# Patient Record
Sex: Female | Born: 1960 | Race: White | Hispanic: Yes | Marital: Single | Smoking: Never smoker
Health system: Southern US, Community
[De-identification: ages and names within clinical notes are randomized; demographics above are authoritative.]

## PROBLEM LIST (undated history)

## (undated) DIAGNOSIS — J45909 Unspecified asthma, uncomplicated: Secondary | ICD-10-CM

## (undated) HISTORY — PX: RETINAL LASER PROCEDURE: SHX2339

## (undated) HISTORY — PX: CYSTECTOMY: SUR359

## (undated) HISTORY — PX: RETINAL DETACHMENT SURGERY: SHX105

---

## 2007-12-29 ENCOUNTER — Ambulatory Visit: Payer: Self-pay | Admitting: Oncology

## 2008-02-02 ENCOUNTER — Emergency Department (HOSPITAL_COMMUNITY): Admission: EM | Admit: 2008-02-02 | Discharge: 2008-02-02 | Payer: Self-pay | Admitting: Emergency Medicine

## 2008-08-18 ENCOUNTER — Emergency Department (HOSPITAL_COMMUNITY): Admission: EM | Admit: 2008-08-18 | Discharge: 2008-08-18 | Payer: Self-pay | Admitting: Emergency Medicine

## 2010-04-22 LAB — COMPREHENSIVE METABOLIC PANEL
ALT: 52 U/L — ABNORMAL HIGH (ref 0–35)
AST: 29 U/L (ref 0–37)
Albumin: 3.1 g/dL — ABNORMAL LOW (ref 3.5–5.2)
Alkaline Phosphatase: 86 U/L (ref 39–117)
Chloride: 105 mEq/L (ref 96–112)
GFR calc Af Amer: >60 mL/min (ref >60)
Potassium: 4.1 mEq/L (ref 3.5–5.1)
Total Bilirubin: 0.5 mg/dL (ref 0.3–1.2)

## 2010-04-22 LAB — WET PREP, GENITAL: Trich, Wet Prep: NONE SEEN

## 2010-04-22 LAB — DIFFERENTIAL
Basophils Relative: 0 % (ref 0–1)
Eosinophils Absolute: 0.1 10*3/uL (ref 0.0–0.7)
Eosinophils Relative: 2 % (ref 0–5)
Monocytes Absolute: 0.5 10*3/uL (ref 0.1–1.0)
Monocytes Relative: 7 % (ref 3–12)

## 2010-04-22 LAB — POCT I-STAT, CHEM 8
Calcium, Ion: 1.18 mmol/L (ref 1.12–1.32)
Chloride: 103 mEq/L (ref 96–112)
Glucose, Bld: 98 mg/dL (ref 70–99)
HCT: 29 % — ABNORMAL LOW (ref 36.0–46.0)
Hemoglobin: 9.9 g/dL — ABNORMAL LOW (ref 12.0–15.0)
TCO2: 25 mmol/L (ref 0–100)

## 2010-04-22 LAB — URINALYSIS, ROUTINE W REFLEX MICROSCOPIC
Bilirubin Urine: NEGATIVE
Ketones, ur: NEGATIVE mg/dL
Nitrite: NEGATIVE
pH: 7 (ref 5.0–8.0)

## 2010-04-22 LAB — URINE CULTURE: Culture: NO GROWTH

## 2010-04-22 LAB — CBC
HCT: 28.4 % — ABNORMAL LOW (ref 36.0–46.0)
Hemoglobin: 9.5 g/dL — ABNORMAL LOW (ref 12.0–15.0)
MCHC: 33.3 g/dL (ref 30.0–36.0)
MCV: 80.7 fL (ref 78.0–100.0)
RBC: 3.52 MIL/uL — ABNORMAL LOW (ref 3.87–5.11)

## 2010-04-22 LAB — GC/CHLAMYDIA PROBE AMP, GENITAL: Chlamydia, DNA Probe: POSITIVE — AB

## 2010-05-01 LAB — URINALYSIS, ROUTINE W REFLEX MICROSCOPIC
Bilirubin Urine: NEGATIVE
Nitrite: NEGATIVE
Specific Gravity, Urine: 1.023 (ref 1.005–1.030)
pH: 6 (ref 5.0–8.0)

## 2010-05-01 LAB — POCT I-STAT, CHEM 8
Chloride: 106 mEq/L (ref 96–112)
Creatinine, Ser: 0.6 mg/dL (ref 0.4–1.2)
Glucose, Bld: 100 mg/dL — ABNORMAL HIGH (ref 70–99)
Potassium: 3.5 mEq/L (ref 3.5–5.1)

## 2010-05-01 LAB — URINE MICROSCOPIC-ADD ON

## 2011-09-18 ENCOUNTER — Encounter (HOSPITAL_COMMUNITY): Payer: Self-pay | Admitting: Emergency Medicine

## 2011-09-18 ENCOUNTER — Emergency Department (HOSPITAL_COMMUNITY)
Admission: EM | Admit: 2011-09-18 | Discharge: 2011-09-18 | Disposition: A | Discharge status: Home / Self Care | Payer: BC Managed Care – PPO | Source: Home / Self Care | Attending: Emergency Medicine | Admitting: Emergency Medicine

## 2011-09-18 DIAGNOSIS — K047 Periapical abscess without sinus: Secondary | ICD-10-CM

## 2011-09-18 DIAGNOSIS — K029 Dental caries, unspecified: Secondary | ICD-10-CM

## 2011-09-18 HISTORY — DX: Unspecified asthma, uncomplicated: J45.909

## 2011-09-18 MED ORDER — CHLORHEXIDINE GLUCONATE 0.12 % MT SOLN: OROMUCOSAL | Status: AC

## 2011-09-18 MED ORDER — HYDROCODONE-ACETAMINOPHEN 5-325 MG PO TABS: ORAL_TABLET | ORAL | Status: AC

## 2011-09-18 MED ORDER — CLINDAMYCIN HCL 150 MG PO CAPS: ORAL_CAPSULE | ORAL | Status: AC

## 2011-09-18 MED ORDER — BUPIVACAINE HCL (PF) 0.5 % IJ SOLN
3.0000 mL | Freq: Once | INTRAMUSCULAR | Status: AC
  Administered 2011-09-18: 3 mL

## 2011-09-18 MED ORDER — IBUPROFEN 600 MG PO TABS: 600.0000 mg | ORAL_TABLET | Freq: Four times a day (QID) | ORAL | Status: AC | PRN

## 2011-09-18 NOTE — ED Notes (Signed)
Toothache onset 2 nights ago.  Tooth that is bothersome is bottom, right

## 2011-09-18 NOTE — ED Provider Notes (Signed)
History     CSN: [id number]  Arrival date & time 09/18/11  0909   First MD Initiated Contact with Patient 09/18/11 1004      Chief Complaint  Patient presents with  . Dental Pain    (Consider location/radiation/quality/duration/timing/severity/associated sxs/prior treatment) HPI Comments: Patient states that she "felt some liquid" go down between her right posterior molar and gum 2 days ago, since then reports constant, dull, throbbing, right lower jaw pain. Mild facial swelling. Does not recall any trauma to her tooth. States she is unable to tolerate any solids or liquids in her mouth. Has been taking 800 mg ibuprofen and using Orajel without relief.  ROS as noted in HPI. All other ROS negative.   Patient is a 51 y.o. female presenting with tooth pain. The history is provided by the patient. No language interpreter was used.  Dental PainThe primary symptoms include mouth pain and dental injury. Primary symptoms do not include oral bleeding, oral lesions, headaches, fever or sore throat. The symptoms are worsening. The symptoms are new. The symptoms occur constantly.  Additional symptoms include: dental sensitivity to temperature, gum tenderness and facial swelling. Additional symptoms do not include: gum swelling, purulent gums, trismus, jaw pain, trouble swallowing and drooling. Medical issues include: periodontal disease. Medical issues do not include: alcohol problem and smoking.    Past Medical History  Diagnosis Date  . Asthma     Past Surgical History  Procedure Date  . Retinal detachment surgery   . Cesarean section   . Retinal laser procedure   . Cystectomy     right forearm    History reviewed. No pertinent family history.  History  Substance Use Topics  . Smoking status: Never Smoker   . Smokeless tobacco: Not on file  . Alcohol Use: No    OB History    Grav Para Term Preterm Abortions TAB SAB Ect Mult Living                  Review of Systems    Constitutional: Negative for fever.  HENT: Positive for facial swelling. Negative for sore throat, drooling and trouble swallowing.   Neurological: Negative for headaches.    Allergies  Penicillins  Home Medications   Current Outpatient Rx  Name Route Sig Dispense Refill  . BENZOCAINE 7.5 % MT GEL Mouth/Throat Use as directed in the mouth or throat 3 (three) times daily as needed.    . CHLORHEXIDINE GLUCONATE 0.12 % MT SOLN  15 mL swish and spit bid 120 mL 0  . CLINDAMYCIN HCL 150 MG PO CAPS  3 tabs po tid x 10 days 90 capsule 0  . HYDROCODONE-ACETAMINOPHEN 5-325 MG PO TABS  1-2 tabs q 6hr prn pain 20 tablet 0  . IBUPROFEN 600 MG PO TABS Oral Take 1 tablet (600 mg total) by mouth every 6 (six) hours as needed for pain. 30 tablet 0    BP 152/84  Pulse 78  Temp 98.2 F (36.8 C) (Oral)  Resp 18  SpO2 98%  LMP 09/07/2011  Physical Exam  HENT:  Head: No trismus in the jaw.  Mouth/Throat: Uvula is midline and oropharynx is clear and moist. Abnormal dentition. Dental caries present.         Purulent drainage from gums. mild R facial swelling  Lymphadenopathy:       Head (right side): No submental, no submandibular and no tonsillar adenopathy present.    She has no cervical adenopathy.    ED Course  Dental Date/Time: 09/18/2011 11:12 AM Performed by: Luiz Blare Authorized by: Luiz Blare Consent: Verbal consent obtained. Risks and benefits: risks, benefits and alternatives were discussed Consent given by: patient Patient understanding: patient states understanding of the procedure being performed Patient consent: the patient's understanding of the procedure matches consent given Required items: required blood products, implants, devices, and special equipment available Patient identity confirmed: verbally with patient Time out: Immediately prior to procedure a "time out" was called to verify the correct patient, procedure, equipment, support staff and  site/side marked as required. Local anesthesia used: yes Local anesthetic: bupivacaine 0.5% without epinephrine Anesthetic total: 0.5 ml Patient sedated: no Patient tolerance: Patient tolerated the procedure well with no immediate complications. Comments: Complete resolution of sx   (including critical care time)  Labs Reviewed - No data to display No results found.   1. Dental caries   2. Dental abscess     MDM  Did dental block with significant relief. Clindamycin, Peridex, Norco, ibuprofen. Pt to f/u with Dr. Burgess Estelle, dentist on call.   Luiz Blare, MD 09/18/11 1134

## 2012-02-12 ENCOUNTER — Emergency Department (HOSPITAL_COMMUNITY)
Admission: EM | Admit: 2012-02-12 | Discharge: 2012-02-12 | Disposition: A | Discharge status: Home / Self Care | Payer: BC Managed Care – PPO | Source: Home / Self Care | Attending: Emergency Medicine | Admitting: Emergency Medicine

## 2012-02-12 ENCOUNTER — Encounter (HOSPITAL_COMMUNITY): Payer: Self-pay

## 2012-02-12 DIAGNOSIS — J45909 Unspecified asthma, uncomplicated: Secondary | ICD-10-CM

## 2012-02-12 DIAGNOSIS — J45901 Unspecified asthma with (acute) exacerbation: Secondary | ICD-10-CM

## 2012-02-12 DIAGNOSIS — J069 Acute upper respiratory infection, unspecified: Secondary | ICD-10-CM

## 2012-02-12 MED ORDER — ALBUTEROL SULFATE (5 MG/ML) 0.5% IN NEBU
INHALATION_SOLUTION | RESPIRATORY_TRACT | Status: AC
  Filled 2012-02-12: qty 1

## 2012-02-12 MED ORDER — PREDNISONE 20 MG PO TABS: 20.0000 mg | ORAL_TABLET | Freq: Two times a day (BID) | ORAL | Status: DC

## 2012-02-12 MED ORDER — METHYLPREDNISOLONE ACETATE 80 MG/ML IJ SUSP
INTRAMUSCULAR | Status: AC
  Filled 2012-02-12: qty 1

## 2012-02-12 MED ORDER — BENZONATATE 200 MG PO CAPS: 200.0000 mg | ORAL_CAPSULE | Freq: Three times a day (TID) | ORAL | Status: DC | PRN

## 2012-02-12 MED ORDER — IPRATROPIUM BROMIDE 0.02 % IN SOLN
0.5000 mg | Freq: Once | RESPIRATORY_TRACT | Status: AC
  Administered 2012-02-12: 0.5 mg via RESPIRATORY_TRACT

## 2012-02-12 MED ORDER — ACETAMINOPHEN 325 MG PO TABS: 650.0000 mg | ORAL_TABLET | Freq: Four times a day (QID) | ORAL | Status: DC | PRN

## 2012-02-12 MED ORDER — ALBUTEROL SULFATE (5 MG/ML) 0.5% IN NEBU
5.0000 mg | INHALATION_SOLUTION | Freq: Once | RESPIRATORY_TRACT | Status: AC
  Administered 2012-02-12: 5 mg via RESPIRATORY_TRACT

## 2012-02-12 MED ORDER — METHYLPREDNISOLONE ACETATE 80 MG/ML IJ SUSP
80.0000 mg | Freq: Once | INTRAMUSCULAR | Status: AC
  Administered 2012-02-12: 80 mg via INTRAMUSCULAR

## 2012-02-12 MED ORDER — BECLOMETHASONE DIPROPIONATE 80 MCG/ACT IN AERS: 2.0000 | INHALATION_SPRAY | Freq: Two times a day (BID) | RESPIRATORY_TRACT | Status: DC

## 2012-02-12 MED ORDER — FEXOFENADINE-PSEUDOEPHED ER 60-120 MG PO TB12: 1.0000 | ORAL_TABLET | Freq: Two times a day (BID) | ORAL | Status: DC

## 2012-02-12 NOTE — ED Notes (Signed)
C/o history of asthma , flare up past few days, cough; last used [facility]I 2 H PTA

## 2012-02-12 NOTE — ED Provider Notes (Signed)
Chief Complaint  Patient presents with  . Asthma    History of Present Illness:   Andrea Wood  is a 52 year old female with a history of asthma for which she usually uses when necessary Ventolin. Since this past Sunday, 3 days ago, she's had dry cough, wheezing, chest tightness, and pressure. She also has had some headache, rhinorrhea, watery eyes, and myalgias. She denies fever or chills. She's had no chest pain and no sputum production. She denies any GI symptoms. She's not been exposed to anything in particular and she's been using her albuterol with fair relief of her symptoms. She's never been hospitalized for the asthma, never had a good emergency room, and never been intubated or on a ventilator.  Review of Systems:  Other than noted above, the patient denies any of the following symptoms. Systemic:  No fever, chills, sweats, fatigue, myalgias, headache, or anorexia. Eye:  No redness, pain or drainage. ENT:  No earache, ear congestion, nasal congestion, sneezing, rhinorrhea, sinus pressure, sinus pain, post nasal drip, or sore throat. Lungs:  No cough, sputum production, wheezing, shortness of breath, or chest pain. GI:  No abdominal pain, nausea, vomiting, or diarrhea.  PMFSH:  Past medical history, family history, social history, meds, and allergies were reviewed.  Physical Exam:   Vital signs:  BP 118/74  Pulse 78  Temp 98.4 F (36.9 C) (Oral)  Resp 16  SpO2 98% General:  Alert, in no distress. Eye:  No conjunctival injection or drainage. Lids were normal. ENT:  TMs and canals were normal, without erythema or inflammation.  Nasal mucosa was clear and uncongested, without drainage.  Mucous membranes were moist.  Pharynx was clear, without exudate or drainage.  There were no oral ulcerations or lesions. Neck:  Supple, no adenopathy, tenderness or mass. Lungs:  No respiratory distress.  Lungs were clear to auscultation, without wheezes, rales or rhonchi.  Breath sounds were clear  and equal bilaterally.  Heart:  Regular rhythm, without gallops, murmers or rubs. Skin:  Clear, warm, and dry, without rash or lesions.  Course in Urgent Care Center:  The patient was given a DuoNeb breathing treatment and Depo-Medrol 80 mg IM. Thereafter she felt better. Her lungs still sound clear but she had little bit better air movement.  Assessment:  The primary encounter diagnosis was Asthma attack. A diagnosis of Viral upper respiratory infection was also pertinent to this visit.  Plan:   1.  The following meds were prescribed:   New Prescriptions   ACETAMINOPHEN (TYLENOL) 325 MG TABLET    Take 2 tablets (650 mg total) by mouth every 6 (six) hours as needed for pain.   BECLOMETHASONE (QVAR) 80 MCG/ACT INHALER    Inhale 2 puffs into the lungs 2 (two) times daily.   BENZONATATE (TESSALON) 200 MG CAPSULE    Take 1 capsule (200 mg total) by mouth 3 (three) times daily as needed for cough.   FEXOFENADINE-PSEUDOEPHEDRINE (ALLEGRA-D) 60-120 MG PER TABLET    Take 1 tablet by mouth every 12 (twelve) hours.   PREDNISONE (DELTASONE) 20 MG TABLET    Take 1 tablet (20 mg total) by mouth 2 (two) times daily.   2.  The patient was instructed in symptomatic care and handouts were given. 3.  The patient was told to return if becoming worse in any way, if no better in 3 or 4 days, and given some red flag symptoms that would indicate earlier return.   Reuben Likes, MD 02/12/12 [phone]

## 2012-04-14 ENCOUNTER — Other Ambulatory Visit: Payer: Self-pay | Admitting: Internal Medicine

## 2012-04-14 ENCOUNTER — Ambulatory Visit
Admission: RE | Admit: 2012-04-14 | Discharge: 2012-04-14 | Disposition: A | Discharge status: Home / Self Care | Payer: BC Managed Care – PPO | Source: Ambulatory Visit | Attending: Internal Medicine | Admitting: Internal Medicine

## 2012-04-14 DIAGNOSIS — R161 Splenomegaly, not elsewhere classified: Secondary | ICD-10-CM

## 2012-04-15 ENCOUNTER — Telehealth: Payer: Self-pay | Admitting: Internal Medicine

## 2012-04-15 NOTE — Telephone Encounter (Signed)
Left pt vm in ref to np appt. °

## 2012-04-16 ENCOUNTER — Telehealth: Payer: Self-pay | Admitting: Internal Medicine

## 2012-04-16 NOTE — Telephone Encounter (Signed)
S/W PT IN REF TO NP APPT. ON 05/12/12@9 :30 REFERRING DR Link Snuffer DX-SPLENOMEGALY MAILED NP PACKET

## 2012-04-18 ENCOUNTER — Telehealth: Payer: Self-pay | Admitting: Internal Medicine

## 2012-04-18 NOTE — Telephone Encounter (Signed)
C/D 04/18/12 for appt. 05/12/12

## 2012-05-09 ENCOUNTER — Other Ambulatory Visit: Payer: Self-pay | Admitting: Medical Oncology

## 2012-05-09 DIAGNOSIS — R161 Splenomegaly, not elsewhere classified: Secondary | ICD-10-CM

## 2012-05-12 ENCOUNTER — Other Ambulatory Visit: Payer: BC Managed Care – PPO | Admitting: Lab

## 2012-05-12 ENCOUNTER — Ambulatory Visit: Payer: BC Managed Care – PPO | Admitting: Internal Medicine

## 2012-05-12 ENCOUNTER — Ambulatory Visit: Payer: BC Managed Care – PPO

## 2012-05-12 ENCOUNTER — Telehealth: Payer: Self-pay | Admitting: Medical Oncology

## 2012-05-12 NOTE — Progress Notes (Signed)
No show. Will not be able to see her again. She should schedule with a different physician if still needs to be seen.

## 2012-05-12 NOTE — Telephone Encounter (Signed)
Pt stated she notified our office that she was going to cancel todays appt and going somewhere else.

## 2012-05-12 NOTE — Telephone Encounter (Signed)
Pt cancelled appt

## 2012-12-22 ENCOUNTER — Emergency Department (HOSPITAL_COMMUNITY)
Admission: EM | Admit: 2012-12-22 | Discharge: 2012-12-22 | Disposition: A | Discharge status: Home / Self Care | Payer: BC Managed Care – PPO | Source: Home / Self Care | Attending: Emergency Medicine | Admitting: Emergency Medicine

## 2012-12-22 ENCOUNTER — Emergency Department (INDEPENDENT_AMBULATORY_CARE_PROVIDER_SITE_OTHER): Payer: BC Managed Care – PPO

## 2012-12-22 ENCOUNTER — Encounter (HOSPITAL_COMMUNITY): Payer: Self-pay | Admitting: Emergency Medicine

## 2012-12-22 DIAGNOSIS — J111 Influenza due to unidentified influenza virus with other respiratory manifestations: Secondary | ICD-10-CM

## 2012-12-22 MED ORDER — FEXOFENADINE-PSEUDOEPHED ER 60-120 MG PO TB12: 1.0000 | ORAL_TABLET | Freq: Two times a day (BID) | ORAL | Status: DC

## 2012-12-22 MED ORDER — OSELTAMIVIR PHOSPHATE 75 MG PO CAPS: 75.0000 mg | ORAL_CAPSULE | Freq: Two times a day (BID) | ORAL | Status: DC

## 2012-12-22 MED ORDER — HYDROCOD POLST-CHLORPHEN POLST 10-8 MG/5ML PO LQCR: 5.0000 mL | Freq: Two times a day (BID) | ORAL | Status: DC | PRN

## 2012-12-22 MED ORDER — ALBUTEROL SULFATE HFA 108 (90 BASE) MCG/ACT IN AERS: 2.0000 | INHALATION_SPRAY | RESPIRATORY_TRACT | Status: DC | PRN

## 2012-12-22 NOTE — Discharge Instructions (Signed)

## 2012-12-22 NOTE — ED Provider Notes (Signed)
Chief Complaint:   Chief Complaint  Patient presents with  . URI    History of Present Illness:   Andrea Wood is a 52 year old female who has had a three-day history of nasal congestion without any drainage, headache, sinus pressure, and watery eyes. She is also had a cough productive green sputum, chest tightness, chest pain when she coughs, fever of up to 101.4, myalgias, and sweats. She has had a sore throat, hoarseness, and nausea and vomiting. She denies any specific exposures. She has not gotten the flu vaccine this year.  Review of Systems:  Other than noted above, the patient denies any of the following symptoms: Systemic:  No fevers, chills, sweats, weight loss or gain, fatigue, or tiredness. Eye:  No redness or discharge. ENT:  No ear pain, drainage, headache, nasal congestion, drainage, sinus pressure, difficulty swallowing, or sore throat. Neck:  No neck pain or swollen glands. Lungs:  No cough, sputum production, hemoptysis, wheezing, chest tightness, shortness of breath or chest pain. GI:  No abdominal pain, nausea, vomiting or diarrhea.  PMFSH:  Past medical history, family history, social history, meds, and allergies were reviewed. She is allergic to penicillin. She has asthma and uses as needed albuterol.  Physical Exam:   Vital signs:  BP 135/104  Pulse 70  Temp(Src) 98.5 F (36.9 C) (Oral)  Resp 16  SpO2 100%  LMP 02/15/2012 General:  Alert and oriented.  In no distress.  Skin warm and dry. Eye:  No conjunctival injection or drainage. Lids were normal. ENT:  TMs and canals were normal, without erythema or inflammation.  Nasal mucosa was clear and uncongested, without drainage.  Mucous membranes were moist.  Pharynx was clear with no exudate or drainage.  There were no oral ulcerations or lesions. Neck:  Supple, no adenopathy, tenderness or mass. Lungs:  No respiratory distress.  Lungs were clear to auscultation, without wheezes, rales or rhonchi.  Breath  sounds were clear and equal bilaterally.  Heart:  Regular rhythm, without gallops, murmers or rubs. Skin:  Clear, warm, and dry, without rash or lesions.  Radiology:  Dg Chest 2 View  12/22/2012   CLINICAL DATA:  Cough and fever for 3 days. Chest pain, shortness of breath.  EXAM: CHEST  2 VIEW  COMPARISON:  Imaged lung bases of an abdomen and pelvis CT 04/14/2012. No prior chest imaging available.  FINDINGS: The cardiomediastinal silhouette is within normal limits. The lungs are well inflated and clear. There is no evidence of pleural effusion or pneumothorax. No acute osseous abnormality is identified.  IMPRESSION: No evidence of active cardiopulmonary disease.   Electronically Signed   By: Sebastian Ache   On: 12/22/2012 08:53   Assessment:  The encounter diagnosis was Influenza-like illness.  No evidence of pneumonia.  Plan:   1.  Meds:  The following meds were prescribed:   Discharge Medication List as of 12/22/2012  9:12 AM    START taking these medications   Details  !! albuterol (PROVENTIL HFA;VENTOLIN HFA) 108 (90 BASE) MCG/ACT inhaler Inhale 2 puffs into the lungs every 4 (four) hours as needed for wheezing or shortness of breath., Starting 12/22/2012, Until Discontinued, Normal    chlorpheniramine-HYDROcodone (TUSSIONEX) 10-8 MG/5ML LQCR Take 5 mLs by mouth every 12 (twelve) hours as needed for cough., Starting 12/22/2012, Until Discontinued, Normal    !! fexofenadine-pseudoephedrine (ALLEGRA-D) 60-120 MG per tablet Take 1 tablet by mouth every 12 (twelve) hours., Starting 12/22/2012, Until Discontinued, Normal    oseltamivir (TAMIFLU) 75 MG  capsule Take 1 capsule (75 mg total) by mouth every 12 (twelve) hours., Starting 12/22/2012, Until Discontinued, Normal     !! - Potential duplicate medications found. Please discuss with provider.      2.  Patient Education/Counseling:  The patient was given appropriate handouts, self care instructions, and instructed in symptomatic relief.   Advised fluids and rest.  3.  Follow up:  The patient was told to follow up if no better in 3 to 4 days, if becoming worse in any way, and given some red flag symptoms such as increasing difficulty breathing or increasing fever which would prompt immediate return.  Follow up here as needed.      Reuben Likes, MD 12/22/12 [phone]

## 2012-12-22 NOTE — ED Notes (Signed)
C/o sore throat.  Productive cough with green sputum. Chest tightness. Sob. Hx of asthma.  Vomiting and fever.  Denies diarrhea.  No relief with otc meds.   On set 2 days ago.

## 2013-01-16 ENCOUNTER — Encounter (HOSPITAL_COMMUNITY): Payer: Self-pay | Admitting: Emergency Medicine

## 2013-01-16 ENCOUNTER — Emergency Department (HOSPITAL_COMMUNITY)
Admission: EM | Admit: 2013-01-16 | Discharge: 2013-01-17 | Disposition: A | Discharge status: Home / Self Care | Payer: BC Managed Care – PPO | Attending: Emergency Medicine | Admitting: Emergency Medicine

## 2013-01-16 ENCOUNTER — Emergency Department (HOSPITAL_COMMUNITY)
Admission: EM | Admit: 2013-01-16 | Discharge: 2013-01-16 | Disposition: A | Discharge status: Nursing Home (Includes ICF) | Payer: BC Managed Care – PPO | Source: Home / Self Care | Attending: Family Medicine | Admitting: Family Medicine

## 2013-01-16 DIAGNOSIS — Z79899 Other long term (current) drug therapy: Secondary | ICD-10-CM | POA: Insufficient documentation

## 2013-01-16 DIAGNOSIS — Z880 Allergy status to penicillin: Secondary | ICD-10-CM | POA: Insufficient documentation

## 2013-01-16 DIAGNOSIS — B9789 Other viral agents as the cause of diseases classified elsewhere: Secondary | ICD-10-CM | POA: Insufficient documentation

## 2013-01-16 DIAGNOSIS — E663 Overweight: Secondary | ICD-10-CM | POA: Insufficient documentation

## 2013-01-16 DIAGNOSIS — R509 Fever, unspecified: Secondary | ICD-10-CM | POA: Insufficient documentation

## 2013-01-16 DIAGNOSIS — R319 Hematuria, unspecified: Secondary | ICD-10-CM | POA: Insufficient documentation

## 2013-01-16 DIAGNOSIS — M549 Dorsalgia, unspecified: Secondary | ICD-10-CM | POA: Insufficient documentation

## 2013-01-16 DIAGNOSIS — B349 Viral infection, unspecified: Secondary | ICD-10-CM

## 2013-01-16 DIAGNOSIS — R109 Unspecified abdominal pain: Secondary | ICD-10-CM

## 2013-01-16 DIAGNOSIS — J45909 Unspecified asthma, uncomplicated: Secondary | ICD-10-CM | POA: Insufficient documentation

## 2013-01-16 DIAGNOSIS — R52 Pain, unspecified: Secondary | ICD-10-CM | POA: Insufficient documentation

## 2013-01-16 LAB — CBC WITH DIFFERENTIAL/PLATELET
Basophils Absolute: 0 10*3/uL (ref 0.0–0.1)
Basophils Relative: 0 % (ref 0–1)
EOS PCT: 4 % (ref 0–5)
Eosinophils Absolute: 0.3 10*3/uL (ref 0.0–0.7)
HCT: 32 % — ABNORMAL LOW (ref 36.0–46.0)
Hemoglobin: 11.5 g/dL — ABNORMAL LOW (ref 12.0–15.0)
LYMPHS ABS: 2.1 10*3/uL (ref 0.7–4.0)
LYMPHS PCT: 29 % (ref 12–46)
MCH: 31 pg (ref 26.0–34.0)
MCHC: 35.9 g/dL (ref 30.0–36.0)
MCV: 86.3 fL (ref 78.0–100.0)
Monocytes Absolute: 0.8 10*3/uL (ref 0.1–1.0)
Monocytes Relative: 11 % (ref 3–12)
NEUTROS ABS: 4.1 10*3/uL (ref 1.7–7.7)
NEUTROS PCT: 56 % (ref 43–77)
PLATELETS: 206 10*3/uL (ref 150–400)
RBC: 3.71 MIL/uL — AB (ref 3.87–5.11)
RDW: 14.9 % (ref 11.5–15.5)
WBC: 7.3 10*3/uL (ref 4.0–10.5)

## 2013-01-16 LAB — POCT URINALYSIS DIP (DEVICE)
Bilirubin Urine: NEGATIVE
GLUCOSE, UA: 100 mg/dL — AB
Ketones, ur: NEGATIVE mg/dL
LEUKOCYTES UA: NEGATIVE
Nitrite: NEGATIVE
Protein, ur: 100 mg/dL — AB
UROBILINOGEN UA: 4 mg/dL — AB (ref 0.0–1.0)
pH: 6.5 (ref 5.0–8.0)

## 2013-01-16 LAB — COMPREHENSIVE METABOLIC PANEL
ALK PHOS: 93 U/L (ref 39–117)
ALT: 13 U/L (ref 0–35)
AST: 13 U/L (ref 0–37)
Albumin: 3.4 g/dL — ABNORMAL LOW (ref 3.5–5.2)
BILIRUBIN TOTAL: 0.9 mg/dL (ref 0.3–1.2)
BUN: 14 mg/dL (ref 6–23)
CHLORIDE: 102 meq/L (ref 96–112)
CO2: 27 meq/L (ref 19–32)
Calcium: 8.2 mg/dL — ABNORMAL LOW (ref 8.4–10.5)
Creatinine, Ser: 0.72 mg/dL (ref 0.50–1.10)
GLUCOSE: 105 mg/dL — AB (ref 70–99)
POTASSIUM: 3.4 meq/L — AB (ref 3.7–5.3)
SODIUM: 138 meq/L (ref 137–147)
Total Protein: 7.3 g/dL (ref 6.0–8.3)

## 2013-01-16 NOTE — ED Notes (Signed)
Pt was at urgent care earlier, pt was told to come to ER for further testing due to urine sample

## 2013-01-16 NOTE — ED Notes (Signed)
C/o  Fever.  Chills.  Body aches.  Headache.   And lower back pain.  States urine is dark.  Hx of uti's.  Denies any other urinary symptoms.  Mild relief with pain/fever reducer.

## 2013-01-16 NOTE — ED Provider Notes (Signed)
CSN: [id number]     Arrival date & time 01/16/13  1632 History   First MD Initiated Contact with Patient 01/16/13 1824     Chief Complaint  Patient presents with  . Back Pain  . Influenza   (Consider location/radiation/quality/duration/timing/severity/associated sxs/prior Treatment) HPI Comments: 53 year old female presents complaining of fever, backache, dark urine, body aches, fatigue, and lightheadedness. This is been going on for couple of days, getting progressively and incrementally worse. Her fever has been up to 102.8 at home today. She feels the backache in her lower back, feeling similar to previous kidney infections. She also admits to nausea without vomiting. She notes that she has a history of some unspecified anemia and an enlarged spleen, she does not know the exact diagnosis. She denies diarrhea, rash. Recently, she was treated for influenza and was completely better from that as of about a week and half ago.  Patient is a 53 y.o. female presenting with back pain and flu symptoms.  Back Pain Associated symptoms: fever   Associated symptoms: no abdominal pain, no chest pain, no dysuria and no weakness   Influenza Presenting symptoms: fatigue, fever and nausea   Presenting symptoms: no cough, no diarrhea, no myalgias, no shortness of breath, no sore throat and no vomiting   Associated symptoms: chills   Associated symptoms: no ear pain     Past Medical History  Diagnosis Date  . Asthma    Past Surgical History  Procedure Laterality Date  . Retinal detachment surgery    . Cesarean section    . Retinal laser procedure    . Cystectomy      right forearm   History reviewed. No pertinent family history. History  Substance Use Topics  . Smoking status: Never Smoker   . Smokeless tobacco: Not on file  . Alcohol Use: No   OB History   Grav Para Term Preterm Abortions TAB SAB Ect Mult Living                 Review of Systems  Constitutional: Positive for fever,  chills, activity change, appetite change and fatigue.  HENT: Negative for ear pain, sinus pressure and sore throat.   Eyes: Negative for visual disturbance.  Respiratory: Negative for cough and shortness of breath.   Cardiovascular: Negative for chest pain, palpitations and leg swelling.  Gastrointestinal: Positive for nausea. Negative for vomiting, abdominal pain, diarrhea and blood in stool.  Endocrine: Negative for polydipsia and polyuria.  Genitourinary: Positive for flank pain. Negative for dysuria, urgency and frequency.       Dark urine  Musculoskeletal: Positive for back pain. Negative for arthralgias and myalgias.  Skin: Negative for rash.  Neurological: Negative for dizziness, weakness and light-headedness.    Allergies  Penicillins and Shellfish allergy  Home Medications   Current Outpatient Rx  Name  Route  Sig  Dispense  Refill  . albuterol (PROVENTIL HFA;VENTOLIN HFA) 108 (90 BASE) MCG/ACT inhaler   Inhalation   Inhale 2 puffs into the lungs every 6 (six) hours as needed.         Marland Kitchen. acetaminophen (TYLENOL) 325 MG tablet   Oral   Take 2 tablets (650 mg total) by mouth every 6 (six) hours as needed for pain.   100 tablet   12   . albuterol (PROVENTIL HFA;VENTOLIN HFA) 108 (90 BASE) MCG/ACT inhaler   Inhalation   Inhale 2 puffs into the lungs every 4 (four) hours as needed for wheezing or shortness of breath.  1 Inhaler   5   . beclomethasone (QVAR) 80 MCG/ACT inhaler   Inhalation   Inhale 2 puffs into the lungs 2 (two) times daily.   1 Inhaler   0   . benzocaine (BABY ORAJEL) 7.5 % oral gel   Mouth/Throat   Use as directed in the mouth or throat 3 (three) times daily as needed.         . benzonatate (TESSALON) 200 MG capsule   Oral   Take 1 capsule (200 mg total) by mouth 3 (three) times daily as needed for cough.   30 capsule   0   . chlorpheniramine-HYDROcodone (TUSSIONEX) 10-8 MG/5ML LQCR   Oral   Take 5 mLs by mouth every 12 (twelve) hours as  needed for cough.   140 mL   0   . fexofenadine-pseudoephedrine (ALLEGRA-D) 60-120 MG per tablet   Oral   Take 1 tablet by mouth every 12 (twelve) hours.   30 tablet   0   . fexofenadine-pseudoephedrine (ALLEGRA-D) 60-120 MG per tablet   Oral   Take 1 tablet by mouth every 12 (twelve) hours.   30 tablet   0   . oseltamivir (TAMIFLU) 75 MG capsule   Oral   Take 1 capsule (75 mg total) by mouth every 12 (twelve) hours.   10 capsule   0   . predniSONE (DELTASONE) 20 MG tablet   Oral   Take 1 tablet (20 mg total) by mouth 2 (two) times daily.   10 tablet   0    BP 122/75  Pulse 77  Temp(Src) 98.7 F (37.1 C) (Oral)  Resp 16  SpO2 100%  LMP 02/15/2012 Physical Exam  Nursing note and vitals reviewed. Constitutional: She is oriented to person, place, and time. Vital signs are normal. She appears well-developed and well-nourished. No distress.  HENT:  Head: Normocephalic and atraumatic.  Eyes: Scleral icterus (Mild) is present.  Cardiovascular: Normal rate, regular rhythm and normal heart sounds.  Exam reveals no gallop and no friction rub.   No murmur heard. Pulmonary/Chest: Effort normal and breath sounds normal. No respiratory distress. She has no wheezes. She has no rales.  Abdominal: She exhibits no ascites and no mass (Difficult to assess due to habitus). There is tenderness in the right upper quadrant, epigastric area and left upper quadrant. There is CVA tenderness (right).  Neurological: She is alert and oriented to person, place, and time. She has normal strength. Coordination normal.  Skin: Skin is warm and dry. No rash noted. She is not diaphoretic.  Psychiatric: She has a normal mood and affect. Judgment normal.    ED Course  Procedures (including critical care time) Labs Review Labs Reviewed  POCT URINALYSIS DIP (DEVICE) - Abnormal; Notable for the following:    Glucose, UA 100 (*)    Hgb urine dipstick MODERATE (*)    Protein, ur 100 (*)     Urobilinogen, UA 4.0 (*)    All other components within normal limits   Imaging Review No results found.    MDM   1. Abdominal  pain, other specified site    On the urinalysis, the urobilinogen is very high at 4.0. This, coupled with the abdominal pain and the mild scleral icterus indicate biliary disease. She needs to get this more fully evaluated. She is being transferred to the emergency department via shuttle.    Graylon Good, PA-C 01/16/13 1912

## 2013-01-16 NOTE — ED Notes (Signed)
The provider [facility] told the pt she had something in her urine.  Sent here for treatment

## 2013-01-16 NOTE — ED Provider Notes (Signed)
CSN: [id number]     Arrival date & time 01/16/13  1913 History   First MD Initiated Contact with Patient 01/16/13 2325     Chief Complaint  Patient presents with  . multiple complaints    (Consider location/radiation/quality/duration/timing/severity/associated sxs/prior Treatment) The history is provided by the patient.  Andrea Wood is a 53 y.o. female history of asthma here presenting with fever and dark urine and body aches. Symptoms started yesterday. She noticed fever 102 at home today. Denies any urinary symptoms but was concerned that he may be a UTI. Went to urgent care and the UA showed some bilirubin in the urine was sent in here for lab work for possible liver malfunction. Denies abdominal pain or jaundice. She was recently treated with Tamiflu for influenza.    Past Medical History  Diagnosis Date  . Asthma    Past Surgical History  Procedure Laterality Date  . Retinal detachment surgery    . Cesarean section    . Retinal laser procedure    . Cystectomy      right forearm   No family history on file. History  Substance Use Topics  . Smoking status: Never Smoker   . Smokeless tobacco: Not on file  . Alcohol Use: No   OB History   Grav Para Term Preterm Abortions TAB SAB Ect Mult Living                 Review of Systems  Constitutional: Positive for fever.  Musculoskeletal: Positive for back pain and myalgias.  All other systems reviewed and are negative.    Allergies  Penicillins and Shellfish allergy  Home Medications   Current Outpatient Rx  Name  Route  Sig  Dispense  Refill  . albuterol (PROVENTIL HFA;VENTOLIN HFA) 108 (90 BASE) MCG/ACT inhaler   Inhalation   Inhale 2 puffs into the lungs every 4 (four) hours as needed for wheezing or shortness of breath.   1 Inhaler   5    BP 144/70  Pulse 71  Temp(Src) 97.9 F (36.6 C) (Oral)  Resp 18  Wt 211 lb 5 oz (95.851 kg)  SpO2 100%  LMP 01/14/2013 Physical Exam  Nursing note and vitals  reviewed. Constitutional: She is oriented to person, place, and time. She appears well-developed and well-nourished.  Overweight, NAD   HENT:  Head: Normocephalic.  Mouth/Throat: Oropharynx is clear and moist.  Eyes: Conjunctivae are normal. Pupils are equal, round, and reactive to light.  Neck: Normal range of motion. Neck supple.  Cardiovascular: Normal rate, regular rhythm and normal heart sounds.   Pulmonary/Chest: Effort normal and breath sounds normal. No respiratory distress. She has no wheezes. She has no rales.  Abdominal: Soft. Bowel sounds are normal. She exhibits no distension. There is no tenderness. There is no rebound and no guarding.  Musculoskeletal: Normal range of motion.  Neurological: She is alert and oriented to person, place, and time.  Skin: Skin is warm and dry.  Psychiatric: She has a normal mood and affect. Her behavior is normal. Judgment and thought content normal.    ED Course  Procedures (including critical care time) Labs Review Labs Reviewed  CBC WITH DIFFERENTIAL - Abnormal; Notable for the following:    RBC 3.71 (*)    Hemoglobin 11.5 (*)    HCT 32.0 (*)    All other components within normal limits  COMPREHENSIVE METABOLIC PANEL - Abnormal; Notable for the following:    Potassium 3.4 (*)    Glucose, Bld  105 (*)    Calcium 8.2 (*)    Albumin 3.4 (*)    All other components within normal limits  URINALYSIS, ROUTINE W REFLEX MICROSCOPIC - Abnormal; Notable for the following:    APPearance CLOUDY (*)    Hgb urine dipstick MODERATE (*)    All other components within normal limits  URINE MICROSCOPIC-ADD ON - Abnormal; Notable for the following:    Squamous Epithelial / LPF MANY (*)    Bacteria, UA FEW (*)    All other components within normal limits   Imaging Review No results found.  EKG Interpretation   None       MDM  No diagnosis found. Andrea Wood is a 53 y.o. female here with likely viral syndrome. LFTs nl. Will recheck  UA, possible lab error.   12:33 AM UA showed mod blood but no bilirubin. She doesn't have periods anymore. On review of records, she always has hematuria. Will have her f/u with urology. Still likely viral syndrome.    Richardean Canal, MD 01/17/13 [phone]

## 2013-01-16 NOTE — ED Notes (Signed)
The pt was sent down from [facility] .  She has had a cold cough back pain chills fever sore throat.  Nauseated  Body aches. lmp   Last month

## 2013-01-17 LAB — URINALYSIS, ROUTINE W REFLEX MICROSCOPIC
BILIRUBIN URINE: NEGATIVE
Glucose, UA: NEGATIVE mg/dL
KETONES UR: NEGATIVE mg/dL
Leukocytes, UA: NEGATIVE
NITRITE: NEGATIVE
Protein, ur: NEGATIVE mg/dL
Specific Gravity, Urine: 1.015 (ref 1.005–1.030)
UROBILINOGEN UA: 1 mg/dL (ref 0.0–1.0)
pH: 5.5 (ref 5.0–8.0)

## 2013-01-17 LAB — URINE MICROSCOPIC-ADD ON

## 2013-01-17 NOTE — Discharge Instructions (Signed)
Stay hydrated.   Take tylenol or motrin for fever or pain.   Follow up with your doctor.   See urology to follow up the blood in urine.   Return to ER if you have vomiting, fever, dehydration.

## 2013-01-20 NOTE — ED Provider Notes (Signed)
Medical screening examination/treatment/procedure(s) were performed by resident physician or non-physician practitioner and as supervising physician I was immediately available for consultation/collaboration.   KINDL,JAMES DOUGLAS MD.   James D Kindl, MD 01/20/13 0842 

## 2013-03-11 ENCOUNTER — Encounter (HOSPITAL_COMMUNITY): Payer: Self-pay | Admitting: Emergency Medicine

## 2013-03-11 ENCOUNTER — Emergency Department (HOSPITAL_COMMUNITY)
Admission: EM | Admit: 2013-03-11 | Discharge: 2013-03-12 | Disposition: A | Discharge status: Home / Self Care | Payer: BC Managed Care – PPO | Attending: Emergency Medicine | Admitting: Emergency Medicine

## 2013-03-11 DIAGNOSIS — Y939 Activity, unspecified: Secondary | ICD-10-CM | POA: Insufficient documentation

## 2013-03-11 DIAGNOSIS — Z880 Allergy status to penicillin: Secondary | ICD-10-CM | POA: Insufficient documentation

## 2013-03-11 DIAGNOSIS — S0101XA Laceration without foreign body of scalp, initial encounter: Secondary | ICD-10-CM

## 2013-03-11 DIAGNOSIS — W1809XA Striking against other object with subsequent fall, initial encounter: Secondary | ICD-10-CM | POA: Insufficient documentation

## 2013-03-11 DIAGNOSIS — J45909 Unspecified asthma, uncomplicated: Secondary | ICD-10-CM | POA: Insufficient documentation

## 2013-03-11 DIAGNOSIS — S0990XA Unspecified injury of head, initial encounter: Secondary | ICD-10-CM

## 2013-03-11 DIAGNOSIS — Y929 Unspecified place or not applicable: Secondary | ICD-10-CM | POA: Insufficient documentation

## 2013-03-11 DIAGNOSIS — S0100XA Unspecified open wound of scalp, initial encounter: Secondary | ICD-10-CM | POA: Insufficient documentation

## 2013-03-11 DIAGNOSIS — Z79899 Other long term (current) drug therapy: Secondary | ICD-10-CM | POA: Insufficient documentation

## 2013-03-11 MED ORDER — OXYCODONE-ACETAMINOPHEN 5-325 MG PO TABS
2.0000 | ORAL_TABLET | Freq: Once | ORAL | Status: AC
  Administered 2013-03-11: 2 via ORAL
  Filled 2013-03-11: qty 2

## 2013-03-11 NOTE — ED Notes (Signed)
Dr. Georgia DomKokut at bedside.

## 2013-03-11 NOTE — ED Notes (Signed)
Pt slipped and fell hitting back of head on concrete step.2 LAC to back of head on left side. Bleeding controlled.

## 2013-03-11 NOTE — ED Notes (Signed)
Dr. Georgia DomKokut placed 6 staples on occiput region of patients head. Pt tolerated very well.

## 2013-03-11 NOTE — ED Notes (Signed)
Suture cart at bedside 

## 2013-03-11 NOTE — ED Notes (Signed)
Pt denies neck pain

## 2013-03-12 ENCOUNTER — Emergency Department (HOSPITAL_COMMUNITY): Payer: BC Managed Care – PPO

## 2013-03-12 NOTE — Discharge Instructions (Signed)
Head Injury, Adult °You have received a head injury. It does not appear serious at this time. Headaches and vomiting are common following head injury. It should be easy to awaken from sleeping. Sometimes it is necessary for you to stay in the emergency department for a while for observation. Sometimes admission to the hospital may be needed. After injuries such as yours, most problems occur within the first 24 hours, but side effects may occur up to 7 10 days after the injury. It is important for you to carefully monitor your condition and contact your health care provider or seek immediate medical care if there is a change in your condition. °WHAT ARE THE TYPES OF HEAD INJURIES? °Head injuries can be as minor as a bump. Some head injuries can be more severe. More severe head injuries include: °· A jarring injury to the brain (concussion). °· A bruise of the brain (contusion). This mean there is bleeding in the brain that can cause swelling. °· A cracked skull (skull fracture). °· Bleeding in the brain that collects, clots, and forms a bump (hematoma). °WHAT CAUSES A HEAD INJURY? °A serious head injury is most likely to happen to someone who is in a car wreck and is not wearing a seat belt. Other causes of major head injuries include bicycle or motorcycle accidents, sports injuries, and falls. °HOW ARE HEAD INJURIES DIAGNOSED? °A complete history of the event leading to the injury and your current symptoms will be helpful in diagnosing head injuries. Many times, pictures of the brain, such as CT or MRI are needed to see the extent of the injury. Often, an overnight hospital stay is necessary for observation.  °WHEN SHOULD I SEEK IMMEDIATE MEDICAL CARE?  °You should get help right away if: °· You have confusion or drowsiness. °· You feel sick to your stomach (nauseous) or have continued, forceful vomiting. °· You have dizziness or unsteadiness that is getting worse. °· You have severe, continued headaches not  relieved by medicine. Only take over-the-counter or prescription medicines for pain, fever, or discomfort as directed by your health care provider. °· You do not have normal function of the arms or legs or are unable to walk. °· You notice changes in the black spots in the center of the colored part of your eye (pupil). °· You have a clear or bloody fluid coming from your nose or ears. °· You have a loss of vision. °During the next 24 hours after the injury, you must stay with someone who can watch you for the warning signs. This person should contact local emergency services (911 in the [place] if you have seizures, you become unconscious, or you are unable to wake up. °HOW CAN I PREVENT A HEAD INJURY IN THE FUTURE? °The most important factor for preventing major head injuries is avoiding motor vehicle accidents.  To minimize the potential for damage to your head, it is crucial to wear seat belts while riding in motor vehicles. Wearing helmets while bike riding and playing collision sports (like football) is also helpful. Also, avoiding dangerous activities around the house will further help reduce your risk of head injury.  °WHEN CAN I RETURN TO NORMAL ACTIVITIES AND ATHLETICS? °You should be reevaluated by your health care provider before returning to these activities. If you have any of the following symptoms, you should not return to activities or contact sports until 1 week after the symptoms have stopped: °· Persistent headache. °· Dizziness or vertigo. °· Poor attention and concentration. °·   Confusion.  Memory problems.  Nausea or vomiting.  Fatigue or tire easily.  Irritability.  Intolerant of bright lights or loud noises.  Anxiety or depression.  Disturbed sleep. MAKE SURE YOU:   Understand these instructions.  Will watch your condition.  Will get help right away if you are not doing well or get worse. Document Released: 01/01/2005 Document Revised: 10/22/2012 Document Reviewed:  09/08/2012 [facility] Patient Information 2014 [place], [place].  Laceration Care, Adult A laceration is a cut that goes through all layers of the skin. The cut goes into the tissue beneath the skin. HOME CARE For stitches (sutures) or staples:  Keep the cut clean and dry.  If you have a bandage (dressing), change it at least once a day. Change the bandage if it gets wet or dirty, or as told by your doctor.  Wash the cut with soap and water 2 times a day. Rinse the cut with water. Pat it dry with a clean towel.  Put a thin layer of medicated cream on the cut as told by your doctor.  You may shower after the first 24 hours. Do not soak the cut in water until the stitches are removed.  Only take medicines as told by your doctor.  Have your stitches or staples removed as told by your doctor. For skin adhesive strips:  Keep the cut clean and dry.  Do not get the strips wet. You may take a bath, but be careful to keep the cut dry.  If the cut gets wet, pat it dry with a clean towel.  The strips will fall off on their own. Do not remove the strips that are still stuck to the cut. For wound glue:  You may shower or take baths. Do not soak or scrub the cut. Do not swim. Avoid heavy sweating until the glue falls off on its own. After a shower or bath, pat the cut dry with a clean towel.  Do not put medicine on your cut until the glue falls off.  If you have a bandage, do not put tape over the glue.  Avoid lots of sunlight or tanning lamps until the glue falls off. Put sunscreen on the cut for the first year to reduce your scar.  The glue will fall off on its own. Do not pick at the glue. You may need a tetanus shot if:  You cannot remember when you had your last tetanus shot.  You have never had a tetanus shot. If you need a tetanus shot and you choose not to have one, you may get tetanus. Sickness from tetanus can be serious. GET HELP RIGHT AWAY IF:   Your pain does not get  better with medicine.  Your arm, hand, leg, or foot loses feeling (numbness) or changes color.  Your cut is bleeding.  Your joint feels weak, or you cannot use your joint.  You have painful lumps on your body.  Your cut is red, puffy (swollen), or painful.  You have a red line on the skin near the cut.  You have yellowish-white fluid (pus) coming from the cut.  You have a fever.  You have a bad smell coming from the cut or bandage.  Your cut breaks open before or after stitches are removed.  You notice something coming out of the cut, such as wood or glass.  You cannot move a finger or toe. MAKE SURE YOU:   Understand these instructions.  Will watch your condition.  Will get  help right away if you are not doing well or get worse. Document Released: 06/20/2007 Document Revised: 03/26/2011 Document Reviewed: 06/27/2010 [facility] Patient Information 2014 [place].   Emergency Department Resource Guide 1) Find a Doctor and Pay Out of Pocket Although you won't have to find out who is covered by your insurance plan, it is a good idea to ask around and get recommendations. You will then need to call the office and see if the doctor you have chosen will accept you as a new patient and what types of options they offer for patients who are self-pay. Some doctors offer discounts or will set up payment plans for their patients who do not have insurance, but you will need to ask so you aren't surprised when you get to your appointment.  2) Contact Your Local Health Department Not all health departments have doctors that can see patients for sick visits, but many do, so it is worth a call to see if yours does. If you don't know where your local health department is, you can check in your phone book. The CDC also has a tool to help you locate your state's health department, and many state websites also have listings of all of their local health departments.  3) Find a Walk-in  Clinic If your illness is not likely to be very severe or complicated, you may want to try a walk in clinic. These are popping up all over the country in pharmacies, drugstores, and shopping centers. They're usually staffed by nurse practitioners or physician assistants that have been trained to treat common illnesses and complaints. They're usually fairly quick and inexpensive. However, if you have serious medical issues or chronic medical problems, these are probably not your best option.  No Primary Care Doctor: - Call Health Connect at  [phone] - they can help you locate a primary care doctor that  accepts your insurance, provides certain services, etc. - Physician Referral Service- [phone]  Chronic Pain Problems: Organization         Address  Phone   Notes  [facility]  [phone] Patients need to be referred by their primary care doctor.   Medication Assistance: Organization         Address  Phone   Notes  [facility] [address] [place], [place] [phone] --Must be a resident of [facility] [place] -- Must have NO insurance coverage whatsoever (no Medicaid/ Medicare, etc.) -- The pt. MUST have a primary care doctor that directs their care regularly and follows them in the community   MedAssist  [phone]   [employer]  [phone]    Agencies that provide inexpensive medical care: Organization         Address  Phone   Notes  [facility]  [phone]   [facility]    [phone]   [facility] [address] [place], [place] [address] [phone]   [facility] [address], [place] [phone]   [facility]anned Parenthood    [phone]   [facility]    [phone]   [facility]  [address], [place] Phone:  [phone], Fax:  [phone] Hours  of Operation:  9 am - 6 pm, M-F.  Also accepts Medicaid/Medicare and self-pay.  [facility]  [address]  [address], [place] Phone: [phone], Fax: [phone]. Hours of Operation:  8:30 am - 5:30 pm, M-F.  Also accepts Medicaid and self-pay.  [facility] [place] [address] [place] Phone: [phone]   [facility] [address] Natasha Bence [place], [place] [phone], Ext. [phone] Mondays & Thursdays: 7-9 AM.  First 15 patients are seen on a first come, first serve basis.    Medicaid-accepting [facility] Providers:  Organization         Address  Phone   Notes  [facility] [address], [place] [phone] Also accepts self-pay patients.  [facility] [address] Laurell Josephs [place], [place]  [phone]   [facility] [address], [place] [phone]   [facility] [address], [place] [phone]   Renaye Rakers [address], [place]   [phone] Only accepts [place] Access [place] patients after they have their name applied to their card.   Self-Pay (no insurance) in [facility]:  Organization         Address  Phone   Notes  Sickle Cell Patients, [facility] Internal Medicine [address] [place], [place] [phone]   [facility] [address] [place], [place] [phone]   [facility]  [address], [place] [phone]   [facility]/Dr. Osei-Bonsu  [address] [place] or [address], [place] [phone] Phone number for both [place] and [place] locations is the same.  Urgent Medical and [facility] [address], [place] [phone]   [facility] [address] [place] or [address] [phone]   [facility] [address], [place] [phone], phone; [phone], fax Sees patients 1st and 3rd Saturday of every month.  Must not qualify for public or private insurance (i.e. Medicaid, Medicare, [place] Health Choice, Veterans' Benefits)  Household income should be no more than 200% of the poverty level The clinic cannot treat you if you are pregnant or think you are pregnant  Sexually transmitted diseases are not treated at the clinic.    Dental Care: Organization         Address  Phone  Notes  [facility] of [facility] [address] [place], [place] [phone] Accepts children up to age 68 who are enrolled in [place] or [place] Health Choice; pregnant women with a Medicaid card; and children who have applied for Medicaid or [place] Health Choice, but were declined, whose parents can pay a reduced fee at time of service.  [facility] Department of [facility]  [address], [place] [phone] Accepts children up to age 60 who are enrolled in [place] or [place] Health Choice; pregnant women with a Medicaid card; and children who have applied for Medicaid or [place] Health Choice, but were declined, whose parents can pay a reduced fee at time of service.  [facility]  [address] [place], [place] [phone] Patients are seen by appointment only. Walk-ins are not accepted. [facility] will see patients 46 years of age and older. Monday - Tuesday (8am-5pm) Most Wednesdays (8:30-5pm) $30 per visit, cash only  [facility]  [address], [facility] [phone] Patients are seen by appointment only. Walk-ins are not accepted. [facility] will see patients 73 years of age and older. One Wednesday Evening (Monthly: Volunteer Based).  $30 per visit, cash only  [employer]  [phone] for adults; Children under age 31, call Graduate  Pediatric Dentistry at [phone]. Children aged 37-14, please call [phone] to request a pediatric application.  Dental services are provided in all areas of dental care including fillings, crowns and bridges, complete and partial dentures, implants, gum treatment, root canals, and extractions. Preventive care is also provided. Treatment is provided to both adults and children. Patients are selected via a lottery and there is often a waiting list.   [facility] [address], [place]  [phone] [email or web address]   [facility] [address] [place], [place] [phone], Ext. [phone] Second and Fourth Thursday of each month, opens at 6:30 AM; Clinic ends at 9 AM.  Patients are seen on a first-come first-served basis, and a limited number are seen during each clinic.   [facility]  [address] Ether Griffins [place], [place] [phone]   Eligibility Requirements You must have lived in [place], [place], or [place] counties for at least the last three months.   You cannot be eligible for state or federal sponsored [place], including [employer], [place], or [employer].   You generally cannot be eligible for healthcare insurance through your employer.    How to apply: Eligibility screenings are held every Tuesday and Wednesday afternoon from 1:00 pm until 4:00 pm. You do not need an appointment for the interview!  [facility] [address], [place], [place] [phone]   [facility] Health Department  [phone]   [facility] Health Department  [phone]   [facility] Health Department  [phone]    Behavioral Health Resources in the Community: Intensive Outpatient Programs Organization         Address  Phone  Notes  [facility] [address] [place], [place] [phone]   [facility] [address] [place], [place]  [phone]   [facility] [address] [place], [place]  [phone]   [facility] Mental Health [address]  [place], [place] [phone] or [phone]   Substance Abuse Resources Organization         Address  Phone  Notes  Alcohol [facility] Drug Services  [phone]   [facility]  [phone]   [facility] [place]  [phone]   Floydene Flock  [phone]   Residential & Outpatient Substance Abuse Program  [phone]   Psychological Services Organization         Address  Phone  Notes  [facility] Behavioral Health  [phone]   [facility] Services  [phone]   [facility] Mental Health [address], [place] [phone] or [phone]    Mobile Crisis Teams Organization         Address  Phone  Notes  [facility], Mobile Crisis Care Unit  [phone]   [facility]  [address] [place], [place] [phone]   Doristine Locks [address] [place] [phone]    Self-Help/Support Groups Organization         Address  Phone             Notes  [facility] [place] -  variety of support groups  336- ([phone] Call for more information  Narcotics Anonymous (NA), [facility] [address], [employer] [place]  2 meetings at this location   Residential Sports administrator         Address  Phone  Notes  [facility] [address],    [place]  [phone]   [facility]  [address] [place] [address], [place], [place] [phone]   [facility] Treatment [facility] [address] [place], [place] [phone] Admissions: 8am-3pm M-F  [facility] [address]    [place], [place] [phone]   [facility] [address] [place], [place], [place] [phone]   [facility] [address]  [place], [place] [phone]   [facility] - Intensive Outpatient [address] Laurell Josephs [address], [place], [place] [phone]   [facility] ([facility] [address] [place] [place]  [place], [place] [phone] or [phone]   [facility] ([facility]) [address] [place], [place] [phone] Accepts Medicaid  [facility] [address],  [place] [phone] Substance Abuse/Addiction Treatment   [facility] Organization         Address  Phone  Notes  [facility]  [phone]   Angie Fava, PhD [address] Ervin Knack [place], [place]   [phone] or [phone]   [facility]   [address] [place], [place] [phone]   [facility] [address] [place], [place] [phone] Insurance/Medicaid/sponsorship through [facility] and Families [address]                                    [place], [place] [phone] Therapy/tele-psych/case  [facility] [address], [place] [phone]    Dr. Lolly Mustache  [phone]   [facility]  [employer] [facility] Dept. 1) [address] [place] 2) [address], [place] 3)  [address], [place] [phone]  [phone]   [facility] Child Abuse Hotline 978-486-3544 or [phone] (After Hours)

## 2013-03-12 NOTE — ED Provider Notes (Signed)
CSN: [id number]     Arrival date & time 03/11/13  2149 History   First MD Initiated Contact with Patient 03/11/13 2242     Chief Complaint  Patient presents with  . Laceration     (Consider location/radiation/quality/duration/timing/severity/associated sxs/prior Treatment) HPI  53 year old female presenting after mechanical fall. Patient slipped and struck the back of her head against a concrete step. No loss of consciousness. Laceration to the area she struck. Pain in this area. No neck or back pain. No acute visual changes. No nausea or vomiting. No acute numbness, tingling or loss of strength. Patient thinks her tetanus is up-to-date. No blood thinning medication. No intervention prior to arrival.  Past Medical History  Diagnosis Date  . Asthma    Past Surgical History  Procedure Laterality Date  . Retinal detachment surgery    . Cesarean section    . Retinal laser procedure    . Cystectomy      right forearm   History reviewed. No pertinent family history. History  Substance Use Topics  . Smoking status: Never Smoker   . Smokeless tobacco: Never Used  . Alcohol Use: No   OB History   Grav Para Term Preterm Abortions TAB SAB Ect Mult Living                 Review of Systems  All systems reviewed and negative, other than as noted in HPI.   Allergies  Penicillins and Shellfish allergy  Home Medications   Current Outpatient Rx  Name  Route  Sig  Dispense  Refill  . acetaminophen (TYLENOL) 500 MG tablet   Oral   Take 1,000 mg by mouth every 6 (six) hours as needed for moderate pain.         Marland Kitchen. albuterol (PROVENTIL HFA;VENTOLIN HFA) 108 (90 BASE) MCG/ACT inhaler   Inhalation   Inhale 1-2 puffs into the lungs every 6 (six) hours as needed for wheezing or shortness of breath.          BP 153/97  Pulse 103  Temp(Src) 98.2 F (36.8 C) (Oral)  Resp 20  Ht 5\' 4"  (1.626 m)  Wt 197 lb (89.359 kg)  BMI 33.80 kg/m2  SpO2 100%  LMP 03/11/2013 Physical Exam   Nursing note and vitals reviewed. Constitutional: She appears well-developed and well-nourished. No distress.  HENT:  Head: Normocephalic.  ~4cm laceration to scalp in occipital region. Explored to base.  No FB or significant vascular injury.   Eyes: Conjunctivae are normal. Right eye exhibits no discharge. Left eye exhibits no discharge.  Neck: Neck supple.  Cardiovascular: Normal rate, regular rhythm and normal heart sounds.  Exam reveals no gallop and no friction rub.   No murmur heard. Pulmonary/Chest: Effort normal and breath sounds normal. No respiratory distress.  Abdominal: Soft. She exhibits no distension. There is no tenderness.  Musculoskeletal: She exhibits no edema and no tenderness.  Neurological: She is alert.  Skin: Skin is warm and dry.  Psychiatric: She has a normal mood and affect. Her behavior is normal. Thought content normal.    ED Course  Procedures (including critical care time)  LACERATION REPAIR Performed by: Raeford RazorKOHUT, Znya Albino Authorized by: Raeford RazorKOHUT, Arra Connaughton Consent: Verbal consent obtained. Risks and benefits: risks, benefits and alternatives were discussed Consent given by: patient Patient identity confirmed: provided demographic data Prepped and Draped in normal sterile fashion Wound explored  Laceration Location: posterior scalp  Laceration Length: 4 cm  No Foreign Bodies seen or palpated  Anesthesia: local infiltration  Local anesthetic:none  Amount of cleaning: standard  Skin closure: single layer  Number of sutures: 6  Technique: stapled  Patient tolerance: Patient tolerated the procedure well with no immediate complications.   Labs Review Labs Reviewed - No data to display Imaging Review No results found.  Ct Head Wo Contrast  03/12/2013   CLINICAL DATA:  Fall  EXAM: CT HEAD WITHOUT CONTRAST  TECHNIQUE: Contiguous axial images were obtained from the base of the skull through the vertex without intravenous contrast.  COMPARISON:   None.  FINDINGS: There is no acute intracranial hemorrhage or infarct. No mass lesion or midline shift. Gray-white matter differentiation is well maintained. Ventricles are normal in size without evidence of hydrocephalus. CSF containing spaces are within normal limits. No extra-axial fluid collection.  The calvarium is intact.  Orbital soft tissues are within normal limits.  Moderate mucosal thickening present within the right sphenoid sinus. Paranasal sinuses are otherwise clear. No mastoid effusion.  Left occipital scalp contusion with laceration is present.  IMPRESSION: Left occipital scalp contusion/laceration. No acute intracranial abnormality identified.   Electronically Signed   By: Rise Mu M.D.   On: 03/12/2013 00:50   EKG Interpretation   None       MDM   Final diagnoses:  Scalp laceration  Head injury    52yF with head injury from mechanical fall. nonfocal neuro exam. Ct w/o fx or bleed. Laceration repaired. Wound care and head injury instructions dicussed. outpt fu as needed and for staple removal.     Raeford Razor, MD 03/16/13 1444

## 2013-03-18 ENCOUNTER — Emergency Department (HOSPITAL_COMMUNITY)
Admission: EM | Admit: 2013-03-18 | Discharge: 2013-03-18 | Disposition: A | Discharge status: Home / Self Care | Payer: BC Managed Care – PPO | Source: Home / Self Care | Attending: Family Medicine | Admitting: Family Medicine

## 2013-03-18 ENCOUNTER — Encounter (HOSPITAL_COMMUNITY): Payer: Self-pay | Admitting: Emergency Medicine

## 2013-03-18 ENCOUNTER — Emergency Department (INDEPENDENT_AMBULATORY_CARE_PROVIDER_SITE_OTHER): Payer: BC Managed Care – PPO

## 2013-03-18 DIAGNOSIS — T148XXA Other injury of unspecified body region, initial encounter: Secondary | ICD-10-CM

## 2013-03-18 MED ORDER — TRAMADOL HCL 50 MG PO TABS: 50.0000 mg | ORAL_TABLET | Freq: Four times a day (QID) | ORAL | Status: DC | PRN

## 2013-03-18 NOTE — ED Provider Notes (Signed)
Andrea Wood is a 53 y.o. female who presents to Urgent Care today for fall. Patient had a mechanical fall February 25. She struck her left low back and head. She suffered a scalp laceration and was seen in the emergency room. The laceration was treated with staples. Head CT at that time was negative. She is here to urgent care today for persistent left lower lateral rib pain. This is severe and worse with inspiration and motion. She denies any radiating pain weakness numbness fevers or chills. She does have a history of splenomegaly. She denies any severe abdominal pain weakness or numbness.   Past Medical History  Diagnosis Date  . Asthma    History  Substance Use Topics  . Smoking status: Never Smoker   . Smokeless tobacco: Never Used  . Alcohol Use: No   ROS as above Medications: No current facility-administered medications for this encounter.   Current Outpatient Prescriptions  Medication Sig Dispense Refill  . acetaminophen (TYLENOL) 500 MG tablet Take 1,000 mg by mouth every 6 (six) hours as needed for moderate pain.      Marland Kitchen. albuterol (PROVENTIL HFA;VENTOLIN HFA) 108 (90 BASE) MCG/ACT inhaler Inhale 1-2 puffs into the lungs every 6 (six) hours as needed for wheezing or shortness of breath.      . traMADol (ULTRAM) 50 MG tablet Take 1 tablet (50 mg total) by mouth every 6 (six) hours as needed.  15 tablet  0    Exam:  BP 160/88  Pulse 68  Temp(Src) 98.4 F (36.9 C) (Oral)  Resp 17  SpO2 98%  LMP 03/08/2013 Gen: Well NAD HEENT: EOMI,  MMM Scalp: 6 staples well-appearing wound.  Lungs: Normal work of breathing. CTABL Heart: RRR no MRG Abd: NABS, Soft. NT, ND Exts: Brisk capillary refill, warm and well perfused.  Back: Tender left lower lateral ribs and left lumbar paraspinals.  Normal back range of motion.  Nontender spinal midline.   No results found for this or any previous visit (from the past 24 hour(s)). Dg Ribs Unilateral W/chest Left  03/18/2013   CLINICAL  DATA:  Status post fall 1 week ago with a blow to the left side of the chest. Left rib pain.  EXAM: LEFT RIBS AND CHEST - 3+ VIEW  COMPARISON:  PA and lateral chest 12/22/2012.  FINDINGS: The lungs are clear. Heart size is normal. There is no pneumothorax or pleural effusion. No rib fracture is identified.  IMPRESSION: Negative exam.   Electronically Signed   By: Drusilla Kannerhomas  Dalessio M.D.   On: 03/18/2013 16:23    Assessment and Plan: 53 y.o. female with most contusion involving the low back. Plan to treat with physical therapy heating pad and tramadol. Followup at sports medicine if not improving. I removed the scalp staples today as well.   Discussed warning signs or symptoms. Please see discharge instructions. Patient expresses understanding.    Rodolph BongEvan S Daijah Scrivens, MD 03/18/13 [phone]

## 2013-03-18 NOTE — Discharge Instructions (Signed)
Thank you for coming in today. Take tramadol for severe pain Use a heating pad Physical therapy should contact you soon Come back or go to the emergency room if you notice new weakness new numbness problems walking or bowel or bladder problems.  Contusion A contusion is a deep bruise. Contusions are the result of an injury that caused bleeding under the skin. The contusion may turn blue, purple, or yellow. Minor injuries will give you a painless contusion, but more severe contusions may stay painful and swollen for a few weeks.  CAUSES  A contusion is usually caused by a blow, trauma, or direct force to an area of the body. SYMPTOMS   Swelling and redness of the injured area.  Bruising of the injured area.  Tenderness and soreness of the injured area.  Pain. DIAGNOSIS  The diagnosis can be made by taking a history and physical exam. An X-ray, CT scan, or MRI may be needed to determine if there were any associated injuries, such as fractures. TREATMENT  Specific treatment will depend on what area of the body was injured. In general, the best treatment for a contusion is resting, icing, elevating, and applying cold compresses to the injured area. Over-the-counter medicines may also be recommended for pain control. Ask your caregiver what the best treatment is for your contusion. HOME CARE INSTRUCTIONS   Put ice on the injured area.  Put ice in a plastic bag.  Place a towel between your skin and the bag.  Leave the ice on for 15-20 minutes, 03-04 times a day.  Only take over-the-counter or prescription medicines for pain, discomfort, or fever as directed by your caregiver. Your caregiver may recommend avoiding anti-inflammatory medicines (aspirin, ibuprofen, and naproxen) for 48 hours because these medicines may increase bruising.  Rest the injured area.  If possible, elevate the injured area to reduce swelling. SEEK IMMEDIATE MEDICAL CARE IF:   You have increased bruising or  swelling.  You have pain that is getting worse.  Your swelling or pain is not relieved with medicines. MAKE SURE YOU:   Understand these instructions.  Will watch your condition.  Will get help right away if you are not doing well or get worse. Document Released: 10/11/2004 Document Revised: 03/26/2011 Document Reviewed: 11/06/2010 [facility] Patient Information 2014 [place], [place].

## 2013-03-18 NOTE — ED Notes (Signed)
Patient reports suffering a fall last Wednesday 2/25 on her concrete porch.  Did go to hospital and received staples in scalp and had ct done.  Does not recall left ribcage being evaluated.  Pain in left back that radiates to the front of torso, into groin area.  Pain is worsening, pain worsens with cough, sneezing

## 2013-04-20 ENCOUNTER — Emergency Department (HOSPITAL_COMMUNITY)
Admission: EM | Admit: 2013-04-20 | Discharge: 2013-04-20 | Disposition: A | Discharge status: Home / Self Care | Payer: BC Managed Care – PPO | Source: Home / Self Care | Attending: Emergency Medicine | Admitting: Emergency Medicine

## 2013-04-20 ENCOUNTER — Encounter (HOSPITAL_COMMUNITY): Payer: Self-pay | Admitting: Emergency Medicine

## 2013-04-20 DIAGNOSIS — J309 Allergic rhinitis, unspecified: Secondary | ICD-10-CM

## 2013-04-20 DIAGNOSIS — J45909 Unspecified asthma, uncomplicated: Secondary | ICD-10-CM

## 2013-04-20 DIAGNOSIS — J069 Acute upper respiratory infection, unspecified: Secondary | ICD-10-CM

## 2013-04-20 MED ORDER — METHYLPREDNISOLONE ACETATE 80 MG/ML IJ SUSP
80.0000 mg | Freq: Once | INTRAMUSCULAR | Status: AC
  Administered 2013-04-20: 80 mg via INTRAMUSCULAR

## 2013-04-20 MED ORDER — PREDNISONE 20 MG PO TABS: ORAL_TABLET | ORAL | Status: DC

## 2013-04-20 MED ORDER — DOXYCYCLINE HYCLATE 100 MG PO TABS: 100.0000 mg | ORAL_TABLET | Freq: Two times a day (BID) | ORAL | Status: DC

## 2013-04-20 MED ORDER — ALBUTEROL SULFATE HFA 108 (90 BASE) MCG/ACT IN AERS: 2.0000 | INHALATION_SPRAY | RESPIRATORY_TRACT | Status: DC | PRN

## 2013-04-20 MED ORDER — FLUTICASONE PROPIONATE 50 MCG/ACT NA SUSP: 2.0000 | Freq: Every day | NASAL | Status: DC

## 2013-04-20 MED ORDER — OLOPATADINE HCL 0.2 % OP SOLN: OPHTHALMIC | Status: DC

## 2013-04-20 MED ORDER — HYDROCOD POLST-CHLORPHEN POLST 10-8 MG/5ML PO LQCR: 5.0000 mL | Freq: Two times a day (BID) | ORAL | Status: DC | PRN

## 2013-04-20 MED ORDER — METHYLPREDNISOLONE ACETATE 80 MG/ML IJ SUSP
INTRAMUSCULAR | Status: AC
  Filled 2013-04-20: qty 1

## 2013-04-20 NOTE — ED Provider Notes (Signed)
Chief Complaint   Chief Complaint  Patient presents with  . Cough    History of Present Illness   Andrea Wood is a 53 year old female who's had a two-day history of dry cough, wheezing, and chest tightness. She has had intermittent asthma all her life and uses a albuterol inhaler as needed. She also complains of nasal congestion with clear rhinorrhea, sneezing, itchy nose, itchy watery eyes, headache, sinus pressure, subjective fever, chills, sweats, hoarseness, and nausea. Her blood pressure was elevated today, but she has had no prior history of high blood pressure.  Review of Systems   Other than as noted above, the patient denies any of the following symptoms: Systemic:  No fevers, chills, sweats, or myalgias. Eye:  No redness or discharge. ENT:  No ear pain, headache, nasal congestion, drainage, sinus pressure, or sore throat. Neck:  No neck pain, stiffness, or swollen glands. Lungs:  No cough, sputum production, hemoptysis, wheezing, chest tightness, shortness of breath or chest pain. GI:  No abdominal pain, nausea, vomiting or diarrhea.  PMFSH   Past medical history, family history, social history, meds, and allergies were reviewed. She is allergic to penicillin.  Physical exam   Vital signs:  BP 155/101  Pulse 68  Temp(Src) 98.2 F (36.8 C) (Oral)  Resp 18  SpO2 100% General:  Alert and oriented.  In no distress.  Skin warm and dry. Eye:  No conjunctival injection or drainage. Lids were normal. ENT:  TMs and canals were normal, without erythema or inflammation.  Nasal mucosa was markedly congested, pale and boggy, without drainage.  Mucous membranes were moist.  Pharynx was clear with no exudate or drainage.  There were no oral ulcerations or lesions. Neck:  Supple, no adenopathy, tenderness or mass. Lungs:  No respiratory distress.  Lungs were clear to auscultation, without wheezes, rales or rhonchi.  Breath sounds were clear and equal bilaterally.  Heart:   Regular rhythm, without gallops, murmers or rubs. Skin:  Clear, warm, and dry, without rash or lesions.   Assessment     The primary encounter diagnosis was Allergic rhinitis. Diagnoses of Viral upper respiratory infection and Asthma were also pertinent to this visit.  Plan    1.  Meds:  The following meds were prescribed:   Discharge Medication List as of 04/20/2013 12:58 PM    START taking these medications   Details  !! albuterol (PROVENTIL HFA;VENTOLIN HFA) 108 (90 BASE) MCG/ACT inhaler Inhale 2 puffs into the lungs every 4 (four) hours as needed for wheezing or shortness of breath., Starting 04/20/2013, Until Discontinued, Normal    chlorpheniramine-HYDROcodone (TUSSIONEX) 10-8 MG/5ML LQCR Take 5 mLs by mouth every 12 (twelve) hours as needed for cough., Starting 04/20/2013, Until Discontinued, Normal    doxycycline (VIBRA-TABS) 100 MG tablet Take 1 tablet (100 mg total) by mouth 2 (two) times daily., Starting 04/20/2013, Until Discontinued, Normal    fluticasone (FLONASE) 50 MCG/ACT nasal spray Place 2 sprays into both nostrils daily., Starting 04/20/2013, Until Discontinued, Normal    Olopatadine HCl (PATADAY) 0.2 % SOLN 1 drop in both eyes daily, Normal    predniSONE (DELTASONE) 20 MG tablet Take 3 daily for 5 days, 2 daily for 5 days, 1 daily for 5 days., Normal     !! - Potential duplicate medications found. Please discuss with provider.      2.  Patient Education/Counseling:  The patient was given appropriate handouts, self care instructions, and instructed in symptomatic relief.  Instructed to get extra fluids, rest,  and use a cool mist vaporizer.  She will need followup on her blood pressure. Was given the name of Dr. Mertha Finders for followup. Suggested avoidance of salt and D.A.S.H. Diet.  3.  Follow up:  The patient was told to follow up here if no better in 3 to 4 days, or sooner if becoming worse in any way, and given some red flag symptoms such as increasing fever, difficulty  breathing, chest pain, or persistent vomiting which would prompt immediate return.  Follow up here as needed.      Reuben Likes, MD 04/20/13 [phone]

## 2013-04-20 NOTE — ED Notes (Signed)
C/o 2 day duration of cough, wheezing, felt feverish

## 2013-04-20 NOTE — Discharge Instructions (Signed)
People who suffer from allergies frequently have symptoms of nasal congestion, runny nose, sneezing, itching of the nose, eyes, ears or throat, mucous in the throat, watering of the eyes and cough.  These symptoms are caused by the body's immune response to environmental allergens.  For seasonal allergies this is pollen (tree pollen in the spring, grass pollen in the summer, and weed pollen in the fall).  Year round allergy symptoms are usually caused by dust or mould.  Many people have year round symptoms which are worse seasonally. ° °For people who have seasonal allergies, pollen avoidance may help to decease symptoms.  This means keeping windows in the house down and windows in the car up.  Run your air conditioning, since this filters out many of the pollen particles.  If you have to spend a prolonged time outdoors during heavy pollen season, it might be prudent to wear a mask.  These can be purchased at any drug store.  When you come in after heavy pollen exposure, your skin, clothing and hair are covered with pollen.  Changing your clothing, taking a shower, and washing your hair may help with your pollen exposure.  Also, your bedding, pillow, and pillowcase may become contaminated with pollen, so frequent washing of your bedding and pillowcase and changing out your pillow may help as well.  (Your pillow can also be a source of dust and mould exposure as well.)  Showering at bedtime may also help. ° °During heavy pollen season (April and September), a large amount of pollen gets trapped in your nasal cavity.  This can contribute to ongoing allergy symptoms.  Saline irrigation of the nasal cavity can help to remove this and relieve allergy symptoms.  This can be accomplished in several ways.  You can mix up your own saline solution using the following recipe:  8 oz of distilled or boiled water, 1/2 tsp of table salt (sodium choride), and a pinch of baking soda (sodium bicarbonate).  If nasal congestion is a  problem.  1 to 2 drops of Afrin solution can be added to this as well.  To do the irrigation, purchase a nasal bulb syringe (the kind you would use to clean out an infant's nose).  Fill this up with the solution, lean you head over a sink with the nostril to be irrigated turned upward, insert the syringe into your nostril, making a tight seal, and gently irrigate, compressing the bulb.  The solution will flow into your nostril and out the other, some may also come out of your mouth.  Repeat this on both sides.  You can do this once daily.  Do not store the solution, mix it up fresh each day.  A commercial solution, called Neomed Solution, can be purchased over the counter without prescription.  You can also use a Netti Pot for irrigation.  These can be purchased at your drug store as well.  Be sure to use distilled or boiled water in these as well and make sure the Netti pot is completely dry between uses. ° °Over the counter medications can be helpful, and in many cases can completely control allergy symptoms without resorting to more expensive prescription meds.   °Antihistamines are the mainstay of allergy treatment.  The newer non-sedating antihistamines are all available over the counter.  These include Allegra, Zyrtec, and Claritin which also can be purchased in their generic forms: fexofenadine, cetirizine, and  Cetirizine.  Combining these meds with a decongestant such as pseudoephedrine or   phenylephrine helps with nasal congestion, but decongestants can also cause elevations in blood pressure.  Pseudoephedrine tends to be more effective than phenylephrine.  The older, more sedating antihistamines such as chlorpheniramine, brompheniramine, and diphenhydramine are also very effective, sometimes more so than the newer antihistamines, but with the price of more sedation.  You should be careful about driving or operating heavy machinery when taking sedating antihistamines, and men with enlarged prostates may  experience urinary retention with diphenhydramine.  Naslacrom nasal spray can be very effective for allergy symptoms.  It is available over the counter and has very few side effects.  The dosage is 2 sprays in each nostril twice daily.  It is recommended that you pinch your nose shut for 30 seconds after using it since it is a watery spray and can run out.  It can be used as long as needed.  There is no risk of dependency.  For people with year round allergies, dust, mould, insect emanations, and pet dander are usually the culprits.  To avoid dust, you need to avoid dust mites which are the main source of allergens in house dust.  Cover your bedding with moisture and mite impervious covers.  These can be purchased at any mattress store.  The modern covers are a little expensive, but not at all uncomfortable. Keeping your house as dry as possible will also help to control dust mites.  Do not use a humidifier and it may help to use a dehumidifier.  Use of a HEPA filter air filter is also a great way to reduce dust and mold exposure.  These units can be purchased commercially.  Make sure to buy one large enough for the room you intend to use it.  Change the filter as per the manufacturer's instructions.  Also, using a HEPA filter vacuum for your carpets is helpful.  There are chemicals that you can sprinkle on your carpet called acaricides that will kill dist mites.  The most commonly used brand is Acarosan.  This can be purchased on line.  It does have to be periodically reapplied.  Wash you pillows and bedsheets regularly in hot water.    Most upper respiratory infections are caused by viruses and do not require antibiotics.  We try to save the antibiotics for when we really need them to prevent bacteria from developing resistance to them.  Here are a few hints about things that can be done at home to help get over an upper respiratory infection quicker:  Get extra sleep and extra fluids.  Get 7 to 9 hours  of sleep per night and 6 to 8 glasses of water a day.  Getting extra sleep keeps the immune system from getting run down.  Most people with an upper respiratory infection are a little dehydrated.  The extra fluids also keep the secretions liquified and easier to deal with.  Also, get extra vitamin C.  4000 mg per day is the recommended dose. For the aches, headache, and fever, acetaminophen or ibuprofen are helpful.  These can be alternated every 4 hours.  People with liver disease should avoid large amounts of acetaminophen, and people with ulcer disease, gastroesophageal reflux, gastritis, congestive heart failure, chronic kidney disease, coronary artery disease and the elderly should avoid ibuprofen. For nasal congestion try Mucinex-D, or if you're having lots of sneezing or clear nasal drainage use Zyrtec-D. People with high blood pressure can take these if their blood pressure is controlled, if not, it's best to avoid  the forms with a "D" (decongestants).  You can use the plain Mucinex, Allegra, Claritin, or Zyrtec even if your blood pressure is not controlled.   A Saline nasal spray such as Ocean Spray can also help.  You can add a decongestant sprays such as Afrin, but you should not use the decongestant sprays for more than 3 or 4 days since they can be habituating.  Breathe Rite nasal strips can also offer a non-drug alternative treatment to nasal congestion, especially at night. For people with symptoms of sinusitis, sleeping with your head elevated can be helpful.  For sinus pain, moist, hot compresses to the face may provide some relief.  Many people find that inhaling steam as in a shower or from a pot of steaming water can help. For any viral infection, zinc containing lozenges such as Cold-Eze or Zicam are helpful.  Zinc helps to fight viral infection.  Hot salt water gargles (8 oz of hot water, 1/2 tsp of table salt, and a pinch of baking soda) can give relief as well as hot beverages such as  hot tea.  Sucrets extra strength lozenges will help the sore throat.  For the cough, take Delsym 2 tsp every 12 hours.  It has also been found recently that Aleve can help control a cough.  The dose is 1 to 2 tablets twice daily with food.  This can be combined with Delsym. (Note, if you are taking ibuprofen, you should not take Aleve as well--take one or the other.) A cool mist vaporizer will help keep your mucous membranes from drying out.   It's important when you have an upper respiratory infection not to pass the infection to others.  This involves being very careful about the following:  Frequent hand washing or use of hand sanitizer, especially after coughing, sneezing, blowing your nose or touching your face, nose or eyes. Do not shake hands or touch anyone and try to avoid touching surfaces that other people use such as doorknobs, shopping carts, telephones and computer keyboards. Use tissues and dispose of them properly in a garbage can or ziplock bag. Cough into your sleeve. Do not let others eat or drink after you.  It's also important to recognize the signs of serious illness and get evaluated if they occur: Any respiratory infection that lasts more than 7 to 10 days.  Yellow nasal drainage and sputum are not reliable indicators of a bacterial infection, but if they last for more than 1 week, see your doctor. Fever and sore throat can indicate strep. Fever and cough can indicate influenza or pneumonia. Any kind of severe symptom such as difficulty breathing, intractable vomiting, or severe pain should prompt you to see a doctor as soon as possible.   Your body's immune system is really the thing that will get rid of this infection.  Your immune system is comprised of 2 types of specialized cells called T cells and B cells.  T cells coordinate the array of cells in your body that engulf invading bacteria or viruses while B cells orchestrate the production of antibodies that neutralize  infection.  Anything we do or any medications we give you, will just strengthen your immune system or help it clear up the infection quicker.  Here are a few helpful hints to improve your immune system to help overcome this illness or to prevent future infections:  A few vitamins can improve the health of your immune system.  That's why your diet should include plenty  of fruits, vegetables, fish, nuts, and whole grains.  Vitamin A and bet-carotene can increase the cells that fight infections (T cells and B cells).  Vitamin A is abundant in dark greens and orange vegetables such as spinach, greens, sweet potatoes, and carrots.  Vitamin B6 contributes to the maturation of white blood cells, the cells that fight disease.  Foods with vitamin B6 include cold cereal and bananas.  Vitamin C is credited with preventing colds because it increases white blood cells and also prevents cellular damage.  Citrus fruits, peaches and green and red bell peppers are all hight in vitamin C.  Vitamin E is an anti-oxidant that encourages the production of natural killer cells which reject foreign invaders and B cells that produce antibodies.  Foods high in vitamin E include wheat germ, nuts and seeds.  Foods high in omega-3 fatty acids found in foods like salmon, tuna and mackerel boost your immune system and help cells to engulf and absorb germs.  Probiotics are good bacteria that increase your T cells.  These can be found in yogurt and are available in supplements such as Culturelle or Align.  Moderate exercise increases the strength of your immune system and your ability to recover from illness.  I suggest 3 to 5 moderate intensity 30 minute workouts per week.    Sleep is another component of maintaining a strong immune system.  It enables your body to recuperate from the day's activities, stress and work.  My recommendation is to get between 7 and 9 hours of sleep per night.  If you smoke, try to quit completely  or at least cut down.  Drink alcohol only in moderation if at all.  No more than 2 drinks daily for men or 1 for women.  Get a flu vaccine early in the fall or if you have not gotten one yet, once this illness has run its course.  If you are over 65, a smoker, or an asthmatic, get a pneumococcal vaccine. My final recommendation is to maintain a healthy weight.  Excess weight can impair the immune system by interfering with the way the immune system deals with invading viruses or bacteria.   Blood pressure over the ideal can put you at higher risk for stroke, heart disease, and kidney failure.  For this reason, it's important to try to get your blood pressure as close as possible to the ideal.  The ideal blood pressure is 120/80.  Blood pressures from 120-139 systolic over 80-89 diastolic are labeled as "prehypertension."  This means you are at higher risk of developing hypertension in the future.  Blood pressures in this range are not treated with medication, but lifestyle changes are recommended to prevent progression to hypertension.  Blood pressures of 140 and above systolic over 90 and above diastolic are classified as hypertension and are treated with medications.  Lifestyle changes which can benefit both prehypertension and hypertension include the following:   Salt and sodium restriction.  Weight loss.  Regular exercise.  Avoidance of tobacco.  Avoidance of excess alcohol.  The "D.A.S.H" diet.   People with hypertension and prehypertension should limit their salt intake to less than 1500 mg daily.  Reading the nutrition information on the label of many prepared foods can give you an idea of how much sodium you're consuming at each meal.  Remember that the most important number on the nutrition information is the serving size.  It may be smaller than you think.  Try to avoid adding  extra salt at the table.  You may add small amounts of salt while cooking.  Remember that salt is an  acquired taste and you may get used to a using a whole lot less salt than you are using now.  Using less salt lets the food's natural flavors come through.  You might want to consider using salt substitutes, potassium chloride, pepper, or blends of herbs and spices to enhance the flavor of your food.  Foods that contain the most salt include: processed meats (like ham, bacon, lunch meat, sausage, hot dogs, and breakfast meat), chips, pretzels, salted nuts, soups, salty snacks, canned foods, junk food, fast food, restaurant food, mustard, pickles, pizza, popcorn, soy sauce, and worcestershire sauce--quite a list!  You might ask, "Is there anything I can eat?"  The answer is, "yes."  Fruits and vegetables are usually low in salt.  Fresh is better than frozen which is better than canned.  If you have canned vegetables, you can cut down on the salt content by rinsing them in tap water 3 times before cooking.     Weight loss is the second thing you can do to lower your blood pressure.  Getting to and maintaining ideal weight will often normalize your blood pressure and allow you to avoid medications, entirely, cut way down on your dosage of medications, or allow to wean off your meds.  (Note, this should only be done under the supervision of your primary care doctor.)  Of course, weight loss takes time and you may need to be on medication in the meantime.  You shoot for a body mass index of 20-25.  When you go to the urgent care or to your primary care doctor, they should calculate your BMI.  If you don't know what it is, ask.  You can calculate your BMI with the following formula:  Weight in pounds x 703/ (height in inches) x (height in inches).  There are many good diets out there: Weight Watchers and the D.A.S.H. Diet are the best, but often, just modifying a few factors can be helpful:  Don't skip meals, don't eat out, and keeping a food diary.  I do not recommend fad diets or diet pills which often raise blood  pressure.    Everyone should get regular exercise, but this is particularly important for people with high blood pressure.  Just about any exercise is good.  The only exercise which may be harmful is lifting extreme heavy weights.  I recommend moderate exercise such as walking for 30 minutes 5 days a week.  Going to the gym for a 50 minute workout 3 times a week is also good.  This amounts to 150 minutes of exercise weekly.   Anyone with high blood pressure should avoid any use of tobacco.  Tobacco use does not elevate blood pressure, but it increases the risk of heart disease and stroke.  If you are interested in quitting, discuss with your doctor how to quit.  If you are not interested in quitting, ask yourself, "What would my life be like in 10 years if I continue to smoke?"  "How will I know when it is time to quit?"  "How would my life be better if I were to quit."   Excess alcohol intake can raise the blood pressure.  The safe alcohol intake is 2 drinks or less per day for men and 1 drink per day or less for women.   There is a very good diet which  I recommend that has been designed for people with blood pressure called the D.A.S.H. Diet (dietary approaches to stop hypertension).  It consists of fruits, vegetables, lean meats, low fat dairy, whole grains, nuts and seeds.  It is very low in salt and sodium.  It has also been found to have other beneficial health effects such as lowering cholesterol and helping lose weight.  It has been developed by the [employer] and can be downloaded from the internet without any cost. Just do a Programmer, multimedia on "D.A.S.H. Diet." or go the NIH website (GolfingPosters.tn).  There are also cookbooks and diet plans that can be gotten from [place] to help you with this diet.   DASH Diet The DASH diet stands for "Dietary Approaches to Stop Hypertension." It is a healthy eating plan that has been shown to reduce high blood pressure (hypertension) in as little  as 14 days, while also possibly providing other significant health benefits. These other health benefits include reducing the risk of breast cancer after menopause and reducing the risk of type 2 diabetes, heart disease, colon cancer, and stroke. Health benefits also include weight loss and slowing kidney failure in patients with chronic kidney disease.  DIET GUIDELINES  Limit salt (sodium). Your diet should contain less than 1500 mg of sodium daily.  Limit refined or processed carbohydrates. Your diet should include mostly whole grains. Desserts and added sugars should be used sparingly.  Include small amounts of heart-healthy fats. These types of fats include nuts, oils, and tub margarine. Limit saturated and trans fats. These fats have been shown to be harmful in the body. CHOOSING FOODS  The following food groups are based on a 2000 calorie diet. See your Registered Dietitian for individual calorie needs. Grains and Grain Products (6 to 8 servings daily)  Eat More Often: Whole-wheat bread, brown rice, whole-grain or wheat pasta, quinoa, popcorn without added fat or salt (air popped).  Eat Less Often: White bread, white pasta, white rice, cornbread. Vegetables (4 to 5 servings daily)  Eat More Often: Fresh, frozen, and canned vegetables. Vegetables may be raw, steamed, roasted, or grilled with a minimal amount of fat.  Eat Less Often/Avoid: Creamed or fried vegetables. Vegetables in a cheese sauce. Fruit (4 to 5 servings daily)  Eat More Often: All fresh, canned (in natural juice), or frozen fruits. Dried fruits without added sugar. One hundred percent fruit juice ( cup [237 mL] daily).  Eat Less Often: Dried fruits with added sugar. Canned fruit in light or heavy syrup. [employer], Fish, and Poultry (2 servings or less daily. One serving is 3 to 4 oz [85-114 g]).  Eat More Often: Ninety percent or leaner ground beef, tenderloin, sirloin. Round cuts of beef, chicken breast, [place]  breast. All fish. Grill, bake, or broil your meat. Nothing should be fried.  Eat Less Often/Avoid: Fatty cuts of meat, [place], or chicken leg, thigh, or wing. Fried cuts of meat or fish. Dairy (2 to 3 servings)  Eat More Often: Low-fat or fat-free milk, low-fat plain or light yogurt, reduced-fat or part-skim cheese.  Eat Less Often/Avoid: Milk (whole, 2%).Whole milk yogurt. Full-fat cheeses. Nuts, Seeds, and Legumes (4 to 5 servings per week)  Eat More Often: All without added salt.  Eat Less Often/Avoid: Salted nuts and seeds, canned beans with added salt. Fats and Sweets (limited)  Eat More Often: Vegetable oils, tub margarines without trans fats, sugar-free gelatin. Mayonnaise and salad dressings.  Eat Less Often/Avoid: Coconut oils, palm  oils, butter, stick margarine, cream, half and half, cookies, candy, pie. FOR MORE INFORMATION The Dash Diet Eating Plan: www.dashdiet.org Document Released: 12/21/2010 Document Revised: 03/26/2011 Document Reviewed: 12/21/2010 [facility] Patient Information 2014 [place], [place].

## 2013-05-07 ENCOUNTER — Encounter (HOSPITAL_COMMUNITY): Payer: Self-pay | Admitting: Emergency Medicine

## 2013-05-07 ENCOUNTER — Emergency Department (HOSPITAL_COMMUNITY)
Admission: EM | Admit: 2013-05-07 | Discharge: 2013-05-07 | Disposition: A | Discharge status: Home / Self Care | Payer: BC Managed Care – PPO | Source: Home / Self Care | Attending: Family Medicine | Admitting: Family Medicine

## 2013-05-07 DIAGNOSIS — L02419 Cutaneous abscess of limb, unspecified: Secondary | ICD-10-CM

## 2013-05-07 DIAGNOSIS — L03119 Cellulitis of unspecified part of limb: Secondary | ICD-10-CM

## 2013-05-07 DIAGNOSIS — L03116 Cellulitis of left lower limb: Secondary | ICD-10-CM

## 2013-05-07 MED ORDER — CLINDAMYCIN HCL 300 MG PO CAPS: 300.0000 mg | ORAL_CAPSULE | Freq: Three times a day (TID) | ORAL | Status: DC

## 2013-05-07 NOTE — ED Notes (Signed)
Pt reports being exposed to poison ivy about 1.5 weeks ago Rash on left leg and right arm w/poss infection Denies f/v/n/d Alert w/no signs of acute distress.

## 2013-05-07 NOTE — Discharge Instructions (Signed)
Thank you for coming in today. Take clindamycin three times daily for 10 days.  Follow up with a doctor if worsening.   Cellulitis Cellulitis is an infection of the skin and the tissue beneath it. The infected area is usually red and tender. Cellulitis occurs most often in the arms and lower legs.  CAUSES  Cellulitis is caused by bacteria that enter the skin through cracks or cuts in the skin. The most common types of bacteria that cause cellulitis are Staphylococcus and Streptococcus. SYMPTOMS   Redness and warmth.  Swelling.  Tenderness or pain.  Fever. DIAGNOSIS  Your caregiver can usually determine what is wrong based on a physical exam. Blood tests may also be done. TREATMENT  Treatment usually involves taking an antibiotic medicine. HOME CARE INSTRUCTIONS   Take your antibiotics as directed. Finish them even if you start to feel better.  Keep the infected arm or leg elevated to reduce swelling.  Apply a warm cloth to the affected area up to 4 times per day to relieve pain.  Only take over-the-counter or prescription medicines for pain, discomfort, or fever as directed by your caregiver.  Keep all follow-up appointments as directed by your caregiver. SEEK MEDICAL CARE IF:   You notice red streaks coming from the infected area.  Your red area gets larger or turns dark in color.  Your bone or joint underneath the infected area becomes painful after the skin has healed.  Your infection returns in the same area or another area.  You notice a swollen bump in the infected area.  You develop new symptoms. SEEK IMMEDIATE MEDICAL CARE IF:   You have a fever.  You feel very sleepy.  You develop vomiting or diarrhea.  You have a general ill feeling (malaise) with muscle aches and pains. MAKE SURE YOU:   Understand these instructions.  Will watch your condition.  Will get help right away if you are not doing well or get worse. Document Released: 10/11/2004  Document Revised: 07/03/2011 Document Reviewed: 03/19/2011 [facility] Patient Information 2014 [place], [place].

## 2013-05-07 NOTE — ED Provider Notes (Signed)
Andrea Wood is a 53 y.o. female who presents to Urgent Care today for left thigh rash. Present starting 2 days ago. Rash is associated with a burning sensation and some mild pain. She denies any fevers chills nausea vomiting or diarrhea. Over the past week she's had fascicular poison ivy type rash on her right arm and notes that she was exposed to poison ivy about a week and half ago. She's been using calamine lotion on her arm. She denies any new medications. She recently was treated with doxycycline for bronchitis but finished that medication about a week ago. She denies any chest pains palpitations or shortness of breath.   Past Medical History  Diagnosis Date  . Asthma    History  Substance Use Topics  . Smoking status: Never Smoker   . Smokeless tobacco: Never Used  . Alcohol Use: No   ROS as above Medications: No current facility-administered medications for this encounter.   Current Outpatient Prescriptions  Medication Sig Dispense Refill  . albuterol (PROVENTIL HFA;VENTOLIN HFA) 108 (90 BASE) MCG/ACT inhaler Inhale 1-2 puffs into the lungs every 6 (six) hours as needed for wheezing or shortness of breath.      Marland Kitchen. acetaminophen (TYLENOL) 500 MG tablet Take 1,000 mg by mouth every 6 (six) hours as needed for moderate pain.      Marland Kitchen. albuterol (PROVENTIL HFA;VENTOLIN HFA) 108 (90 BASE) MCG/ACT inhaler Inhale 2 puffs into the lungs every 4 (four) hours as needed for wheezing or shortness of breath.  1 Inhaler  12  . clindamycin (CLEOCIN) 300 MG capsule Take 1 capsule (300 mg total) by mouth 3 (three) times daily.  30 capsule  0  . Olopatadine HCl (PATADAY) 0.2 % SOLN 1 drop in both eyes daily  2.5 mL  12  . [DISCONTINUED] fluticasone (FLONASE) 50 MCG/ACT nasal spray Place 2 sprays into both nostrils daily.  16 g  3    Exam:  BP 135/73  Pulse 83  Temp(Src) 98.3 F (36.8 C) (Oral)  Resp 16  SpO2 100%  LMP 05/07/2013 Gen: Well NAD HEENT: EOMI,  MMM Lungs: Normal work of  breathing. CTABL Heart: RRR no MRG Abd: NABS, Soft. NT, ND Exts: Brisk capillary refill, warm and well perfused.  Skin: Large 5 cm area of erythema with central induration. No fluctuance noted. Mildly tender. Located on the anterior left  No results found for this or any previous visit (from the past 24 hour(s)). No results found.  Assessment and Plan: 53 y.o. female with cellulitis. Most likely explanation. Plan to treat with clindamycin as patient is allergic to penicillins. Close followup.  Discussed warning signs or symptoms. Please see discharge instructions. Patient expresses understanding.    Rodolph BongEvan S Mililani Murthy, MD 05/07/13 1438

## 2013-11-25 ENCOUNTER — Encounter (INDEPENDENT_AMBULATORY_CARE_PROVIDER_SITE_OTHER): Payer: Self-pay | Admitting: Ophthalmology

## 2014-10-18 ENCOUNTER — Emergency Department (HOSPITAL_COMMUNITY): Admission: EM | Admit: 2014-10-18 | Discharge: 2014-10-18 | Payer: Self-pay

## 2014-10-19 ENCOUNTER — Encounter (HOSPITAL_COMMUNITY): Payer: Self-pay | Admitting: Emergency Medicine

## 2014-10-19 ENCOUNTER — Emergency Department (HOSPITAL_COMMUNITY)
Admission: EM | Admit: 2014-10-19 | Discharge: 2014-10-19 | Disposition: A | Discharge status: Home / Self Care | Payer: BLUE CROSS/BLUE SHIELD | Source: Home / Self Care | Attending: Emergency Medicine | Admitting: Emergency Medicine

## 2014-10-19 DIAGNOSIS — H0016 Chalazion left eye, unspecified eyelid: Secondary | ICD-10-CM | POA: Diagnosis not present

## 2014-10-19 MED ORDER — GENTAMICIN SULFATE 0.3 % OP OINT: TOPICAL_OINTMENT | Freq: Three times a day (TID) | OPHTHALMIC | Status: DC

## 2014-10-19 NOTE — ED Notes (Signed)
C/o intermittent left eye swelling and pain w/pressure onset Sunday Denies fevers, chills, inj/trauma A&O x4... No acute distress.

## 2014-10-19 NOTE — ED Provider Notes (Signed)
CSN: [id number]     Arrival date & time 10/19/14  1620 History   First MD Initiated Contact with Patient 10/19/14 1803     Chief Complaint  Patient presents with  . Eye Problem   (Consider location/radiation/quality/duration/timing/severity/associated sxs/prior Treatment) HPI Comments: 54 year old female that noticed some swelling to the upper eyelid 2 days ago. It has been getting larger in size. There is now tenderness with the sensation of a small growth or ball underneath the upper eyelid medial aspect. Denies problem with vision or with the eye proper. No drainage. She has been applying warm compresses.   Past Medical History  Diagnosis Date  . Asthma    Past Surgical History  Procedure Laterality Date  . Retinal detachment surgery    . Cesarean section    . Retinal laser procedure    . Cystectomy      right forearm   No family history on file. Social History  Substance Use Topics  . Smoking status: Never Smoker   . Smokeless tobacco: Never Used  . Alcohol Use: No   OB History    No data available     Review of Systems  Constitutional: Negative.   HENT: Negative.   Eyes: Positive for redness. Negative for photophobia, pain, discharge, itching and visual disturbance.  Respiratory: Negative.   All other systems reviewed and are negative.   Allergies  Penicillins and Shellfish allergy  Home Medications   Prior to Admission medications   Medication Sig Start Date End Date Taking? Authorizing Provider  clindamycin (CLEOCIN) 300 MG capsule Take 1 capsule (300 mg total) by mouth 3 (three) times daily. 05/07/13  Yes Rodolph Bong, MD  acetaminophen (TYLENOL) 500 MG tablet Take 1,000 mg by mouth every 6 (six) hours as needed for moderate pain.    Historical Provider, MD  albuterol (PROVENTIL HFA;VENTOLIN HFA) 108 (90 BASE) MCG/ACT inhaler Inhale 1-2 puffs into the lungs every 6 (six) hours as needed for wheezing or shortness of breath.    Historical Provider, MD   albuterol (PROVENTIL HFA;VENTOLIN HFA) 108 (90 BASE) MCG/ACT inhaler Inhale 2 puffs into the lungs every 4 (four) hours as needed for wheezing or shortness of breath. 04/20/13   Reuben Likes, MD  gentamicin (GARAMYCIN) 0.3 % ophthalmic ointment Place into the left eye 3 (three) times daily. 10/19/14   Hayden Rasmussen, NP  Olopatadine HCl (PATADAY) 0.2 % SOLN 1 drop in both eyes daily 04/20/13   Reuben Likes, MD   Meds Ordered and Administered this Visit  Medications - No data to display  BP 138/82 mmHg  Pulse 69  Temp(Src) 97.3 F (36.3 C) (Oral)  Resp 16  SpO2 100%  LMP 05/07/2013 No data found.   Physical Exam  Constitutional: She is oriented to person, place, and time. She appears well-developed and well-nourished. No distress.  Eyes: EOM are normal. Pupils are equal, round, and reactive to light. Right eye exhibits no discharge. Left eye exhibits no discharge.  Upper and lower conjunctiva of the left eye as mildly erythematous. There is a marginated thickening to the medial aspect of the upper eyelid consistent with chalazion. Sclera is clear. Anterior chamber clear.  Neck: Normal range of motion. Neck supple.  Cardiovascular: Normal rate.   Pulmonary/Chest: Effort normal. No respiratory distress.  Neurological: She is alert and oriented to person, place, and time. She exhibits normal muscle tone.  Skin: Skin is warm and dry.  Psychiatric: She has a normal mood and affect.  Nursing  note and vitals reviewed.   ED Course  Procedures (including critical care time)  Labs Review Labs Reviewed - No data to display  Imaging Review No results found.   Visual Acuity Review  Right Eye Distance:   Left Eye Distance:   Bilateral Distance:    Right Eye Near:   Left Eye Near:    Bilateral Near:         MDM   1. Chalazion of left eye    Warm compreses Gentamycin op oint tid Call on call opthal this week if worse or not better    Hayden Rasmussen, NP 10/19/14 1857

## 2015-03-01 ENCOUNTER — Encounter (HOSPITAL_COMMUNITY): Payer: Self-pay | Admitting: Emergency Medicine

## 2015-03-01 ENCOUNTER — Emergency Department (HOSPITAL_COMMUNITY): Payer: BLUE CROSS/BLUE SHIELD

## 2015-03-01 ENCOUNTER — Other Ambulatory Visit: Payer: Self-pay

## 2015-03-01 DIAGNOSIS — Z8679 Personal history of other diseases of the circulatory system: Secondary | ICD-10-CM | POA: Diagnosis not present

## 2015-03-01 DIAGNOSIS — Z880 Allergy status to penicillin: Secondary | ICD-10-CM | POA: Diagnosis not present

## 2015-03-01 DIAGNOSIS — R509 Fever, unspecified: Secondary | ICD-10-CM | POA: Insufficient documentation

## 2015-03-01 DIAGNOSIS — R079 Chest pain, unspecified: Secondary | ICD-10-CM | POA: Diagnosis present

## 2015-03-01 DIAGNOSIS — R112 Nausea with vomiting, unspecified: Secondary | ICD-10-CM | POA: Insufficient documentation

## 2015-03-01 DIAGNOSIS — R51 Headache: Secondary | ICD-10-CM | POA: Insufficient documentation

## 2015-03-01 DIAGNOSIS — J45909 Unspecified asthma, uncomplicated: Secondary | ICD-10-CM | POA: Diagnosis not present

## 2015-03-01 DIAGNOSIS — J45901 Unspecified asthma with (acute) exacerbation: Secondary | ICD-10-CM | POA: Diagnosis not present

## 2015-03-01 DIAGNOSIS — R109 Unspecified abdominal pain: Secondary | ICD-10-CM | POA: Insufficient documentation

## 2015-03-01 LAB — CBC
HEMATOCRIT: 39.6 % (ref 36.0–46.0)
Hemoglobin: 13.7 g/dL (ref 12.0–15.0)
MCH: 30.8 pg (ref 26.0–34.0)
MCHC: 34.6 g/dL (ref 30.0–36.0)
MCV: 89 fL (ref 78.0–100.0)
PLATELETS: 215 10*3/uL (ref 150–400)
RBC: 4.45 MIL/uL (ref 3.87–5.11)
RDW: 13.6 % (ref 11.5–15.5)
WBC: 9 10*3/uL (ref 4.0–10.5)

## 2015-03-01 LAB — BASIC METABOLIC PANEL
Anion gap: 10 (ref 5–15)
BUN: 15 mg/dL (ref 6–20)
CO2: 26 mmol/L (ref 22–32)
CREATININE: 0.67 mg/dL (ref 0.44–1.00)
Calcium: 9.2 mg/dL (ref 8.9–10.3)
Chloride: 103 mmol/L (ref 101–111)
GFR calc Af Amer: >60 mL/min (ref >60)
GLUCOSE: 136 mg/dL — AB (ref 65–99)
POTASSIUM: 3.5 mmol/L (ref 3.5–5.1)
Sodium: 139 mmol/L (ref 135–145)

## 2015-03-01 LAB — I-STAT TROPONIN, ED: Troponin i, poc: 0 ng/mL (ref 0.00–0.08)

## 2015-03-01 NOTE — ED Notes (Signed)
Pt. reports left chest pain with SOB , emesis and mild diaphoresis onset this afternoon with occasional dry cough .

## 2015-03-02 ENCOUNTER — Emergency Department (HOSPITAL_COMMUNITY)
Admission: EM | Admit: 2015-03-02 | Discharge: 2015-03-02 | Disposition: A | Discharge status: Home / Self Care | Payer: BLUE CROSS/BLUE SHIELD | Attending: Emergency Medicine | Admitting: Emergency Medicine

## 2015-03-02 DIAGNOSIS — R509 Fever, unspecified: Secondary | ICD-10-CM

## 2015-03-02 DIAGNOSIS — R079 Chest pain, unspecified: Secondary | ICD-10-CM

## 2015-03-02 DIAGNOSIS — R6889 Other general symptoms and signs: Secondary | ICD-10-CM

## 2015-03-02 LAB — I-STAT TROPONIN, ED: Troponin i, poc: 0 ng/mL (ref 0.00–0.08)

## 2015-03-02 MED ORDER — KETOROLAC TROMETHAMINE 30 MG/ML IJ SOLN
60.0000 mg | Freq: Once | INTRAMUSCULAR | Status: AC
Start: 2015-03-02 — End: 2015-03-02
  Administered 2015-03-02: 60 mg via INTRAMUSCULAR
  Filled 2015-03-02: qty 2

## 2015-03-02 MED ORDER — ONDANSETRON 4 MG PO TBDP: 4.0000 mg | ORAL_TABLET | Freq: Three times a day (TID) | ORAL | Status: DC | PRN

## 2015-03-02 MED ORDER — DICYCLOMINE HCL 10 MG PO CAPS
20.0000 mg | ORAL_CAPSULE | Freq: Once | ORAL | Status: AC
  Administered 2015-03-02: 20 mg via ORAL
  Filled 2015-03-02: qty 2

## 2015-03-02 MED ORDER — OSELTAMIVIR PHOSPHATE 75 MG PO CAPS: 75.0000 mg | ORAL_CAPSULE | Freq: Two times a day (BID) | ORAL | Status: DC

## 2015-03-02 MED ORDER — IBUPROFEN 800 MG PO TABS: 800.0000 mg | ORAL_TABLET | Freq: Three times a day (TID) | ORAL | Status: DC | PRN

## 2015-03-02 NOTE — Discharge Instructions (Signed)
Fever, Adult A fever is an increase in the body's temperature. It is usually defined as a temperature of 100F (38C) or higher. Brief mild or moderate fevers generally have no long-term effects, and they often do not require treatment. Moderate or high fevers may make you feel uncomfortable and can sometimes be a sign of a serious illness or disease. The sweating that may occur with repeated or prolonged fever may also cause dehydration. Fever is confirmed by taking a temperature with a thermometer. A measured temperature can vary with:  Age.  Time of day.  Location of the thermometer:  Mouth (oral).  Rectum (rectal).  Ear (tympanic).  Underarm (axillary).  Forehead (temporal). HOME CARE INSTRUCTIONS Pay attention to any changes in your symptoms. Take these actions to help with your condition:  Take over-the counter and prescription medicines only as told by your health care provider. Follow the dosing instructions carefully.  If you were prescribed an antibiotic medicine, take it as told by your health care provider. Do not stop taking the antibiotic even if you start to feel better.  Rest as needed.  Drink enough fluid to keep your urine clear or pale yellow. This helps to prevent dehydration.  Sponge yourself or bathe with room-temperature water to help reduce your body temperature as needed. Do not use ice water.  Do not overbundle yourself in blankets or heavy clothes. SEEK MEDICAL CARE IF:  You vomit.  You cannot eat or drink without vomiting.  You have diarrhea.  You have pain when you urinate.  Your symptoms do not improve with treatment.  You develop new symptoms.  You develop excessive weakness. SEEK IMMEDIATE MEDICAL CARE IF:  You have shortness of breath or have trouble breathing.  You are dizzy or you faint.  You are disoriented or confused.  You develop signs of dehydration, such as a dry mouth, decreased urination, or paleness.  You develop  severe pain in your abdomen.  You have persistent vomiting or diarrhea.  You develop a skin rash.  Your symptoms suddenly get worse.   This information is not intended to replace advice given to you by your health care provider. Make sure you discuss any questions you have with your health care provider.   Document Released: 06/27/2000 Document Revised: 09/22/2014 Document Reviewed: 02/25/2014 Elsevier Interactive Patient Education 2016 [employer]  Nonspecific Chest Pain  Chest pain can be caused by many different conditions. There is always a chance that your pain could be related to something serious, such as a heart attack or a blood clot in your lungs. Chest pain can also be caused by conditions that are not life-threatening. If you have chest pain, it is very important to follow up with your health care provider. CAUSES  Chest pain can be caused by:  Heartburn.  Pneumonia or bronchitis.  Anxiety or stress.  Inflammation around your heart (pericarditis) or lung (pleuritis or pleurisy).  A blood clot in your lung.  A collapsed lung (pneumothorax). It can develop suddenly on its own (spontaneous pneumothorax) or from trauma to the chest.  Shingles infection (varicella-zoster virus).  Heart attack.  Damage to the bones, muscles, and cartilage that make up your chest wall. This can include:  Bruised bones due to injury.  Strained muscles or cartilage due to frequent or repeated coughing or overwork.  Fracture to one or more ribs.  Sore cartilage due to inflammation (costochondritis). RISK FACTORS  Risk factors for chest pain may include:  Activities that increase your  risk for trauma or injury to your chest.  Respiratory infections or conditions that cause frequent coughing.  Medical conditions or overeating that can cause heartburn.  Heart disease or family history of heart disease.  Conditions or health behaviors that increase your risk of developing a  blood clot.  Having had chicken pox (varicella zoster). SIGNS AND SYMPTOMS Chest pain can feel like:  Burning or tingling on the surface of your chest or deep in your chest.  Crushing, pressure, aching, or squeezing pain.  Dull or sharp pain that is worse when you move, cough, or take a deep breath.  Pain that is also felt in your back, neck, shoulder, or arm, or pain that spreads to any of these areas. Your chest pain may come and go, or it may stay constant. DIAGNOSIS Lab tests or other studies may be needed to find the cause of your pain. Your health care provider may have you take a test called an ambulatory ECG (electrocardiogram). An ECG records your heartbeat patterns at the time the test is performed. You may also have other tests, such as:  Transthoracic echocardiogram (TTE). During echocardiography, sound waves are used to create a picture of all of the heart structures and to look at how blood flows through your heart.  Transesophageal echocardiogram (TEE).This is a more advanced imaging test that obtains images from inside your body. It allows your health care provider to see your heart in finer detail.  Cardiac monitoring. This allows your health care provider to monitor your heart rate and rhythm in real time.  Holter monitor. This is a portable device that records your heartbeat and can help to diagnose abnormal heartbeats. It allows your health care provider to track your heart activity for several days, if needed.  Stress tests. These can be done through exercise or by taking medicine that makes your heart beat more quickly.  Blood tests.  Imaging tests. TREATMENT  Your treatment depends on what is causing your chest pain. Treatment may include:  Medicines. These may include:  Acid blockers for heartburn.  Anti-inflammatory medicine.  Pain medicine for inflammatory conditions.  Antibiotic medicine, if an infection is present.  Medicines to dissolve blood  clots.  Medicines to treat coronary artery disease.  Supportive care for conditions that do not require medicines. This may include:  Resting.  Applying heat or cold packs to injured areas.  Limiting activities until pain decreases. HOME CARE INSTRUCTIONS  If you were prescribed an antibiotic medicine, finish it all even if you start to feel better.  Avoid any activities that bring on chest pain.  Do not use any tobacco products, including cigarettes, chewing tobacco, or electronic cigarettes. If you need help quitting, ask your health care provider.  Do not drink alcohol.  Take medicines only as directed by your health care provider.  Keep all follow-up visits as directed by your health care provider. This is important. This includes any further testing if your chest pain does not go away.  If heartburn is the cause for your chest pain, you may be told to keep your head raised (elevated) while sleeping. This reduces the chance that acid will go from your stomach into your esophagus.  Make lifestyle changes as directed by your health care provider. These may include:  Getting regular exercise. Ask your health care provider to suggest some activities that are safe for you.  Eating a heart-healthy diet. A registered dietitian can help you to learn healthy eating options.  Maintaining  a healthy weight.  Managing diabetes, if necessary.  Reducing stress. SEEK MEDICAL CARE IF:  Your chest pain does not go away after treatment.  You have a rash with blisters on your chest.  You have a fever. SEEK IMMEDIATE MEDICAL CARE IF:   Your chest pain is worse.  You have an increasing cough, or you cough up blood.  You have severe abdominal pain.  You have severe weakness.  You faint.  You have chills.  You have sudden, unexplained chest discomfort.  You have sudden, unexplained discomfort in your arms, back, neck, or jaw.  You have shortness of breath at any  time.  You suddenly start to sweat, or your skin gets clammy.  You feel nauseous or you vomit.  You suddenly feel light-headed or dizzy.  Your heart begins to beat quickly, or it feels like it is skipping beats. These symptoms may represent a serious problem that is an emergency. Do not wait to see if the symptoms will go away. Get medical help right away. Call your local emergency services (911 in the [place] Do not drive yourself to the hospital.   This information is not intended to replace advice given to you by your health care provider. Make sure you discuss any questions you have with your health care provider.   Document Released: 10/11/2004 Document Revised: 01/22/2014 Document Reviewed: 08/07/2013 Elsevier Interactive Patient Education [employer]

## 2015-03-02 NOTE — ED Notes (Signed)
Pt left with all her belongings and ambulated out of the treatment area.  

## 2015-03-02 NOTE — ED Provider Notes (Signed)
CHIEF COMPLAINT: Fever, chest pain, nausea and vomiting, abdominal cramping, cough, sore throat  HPI: Patient is a 55 year old female with history of asthma and migraines who presents emergency department with complaints of fever that started yesterday. States around 5 PM she developed left-sided chest pain that went down into her left arm. Pain lasted for 2 hours and is now gone. She did have associated shortness of breath. No diaphoresis or dizziness. States later that day she began having vomiting and abdominal cramping as well. No diarrhea. No dysuria, hematuria, vaginal bleeding or discharge. Her last menstrual period was 2015. No rash. No sick contacts or recent travel. Has not had an influenza vaccination this year. No history of PE or DVT. Patient is not on exogenous estrogen. She states she is not a smoker. No lower extremity swelling or pain. Recent long travel, surgery, trauma, fracture, hospitalization. No prior history of stress test or cardiac catheterization. She has no known history of CAD and no family history of premature CAD.  ROS: See HPI Constitutional: no fever  Eyes: no drainage  ENT: no runny nose   Cardiovascular:  no chest pain  Resp: no SOB  GI: no vomiting GU: no dysuria Integumentary: no rash  Allergy: no hives  Musculoskeletal: no leg swelling  Neurological: no slurred speech ROS otherwise negative  PAST MEDICAL HISTORY/PAST SURGICAL HISTORY:  Past Medical History  Diagnosis Date  . Asthma     MEDICATIONS:  Prior to Admission medications   Medication Sig Start Date End Date Taking? Authorizing Provider  albuterol (PROVENTIL HFA;VENTOLIN HFA) 108 (90 BASE) MCG/ACT inhaler Inhale 2 puffs into the lungs every 4 (four) hours as needed for wheezing or shortness of breath. Patient not taking: Reported on 03/02/2015 04/20/13   Reuben Likes, MD  clindamycin (CLEOCIN) 300 MG capsule Take 1 capsule (300 mg total) by mouth 3 (three) times daily. Patient not taking:  Reported on 03/02/2015 05/07/13   Rodolph Bong, MD  gentamicin (GARAMYCIN) 0.3 % ophthalmic ointment Place into the left eye 3 (three) times daily. Patient not taking: Reported on 03/02/2015 10/19/14   Hayden Rasmussen, NP  Olopatadine HCl (PATADAY) 0.2 % SOLN 1 drop in both eyes daily Patient not taking: Reported on 03/02/2015 04/20/13   Reuben Likes, MD    ALLERGIES:  Allergies  Allergen Reactions  . Penicillins     angioedema  . Shellfish Allergy Hives    SOCIAL HISTORY:  Social History  Substance Use Topics  . Smoking status: Never Smoker   . Smokeless tobacco: Never Used  . Alcohol Use: No    FAMILY HISTORY: No family history on file.  EXAM: BP 131/85 mmHg  Pulse 94  Temp(Src) 99.1 F (37.3 C) (Oral)  Resp 19  Ht  (1.626 m)  Wt 189 lb (85.73 kg)  BMI 32.43 kg/m2  SpO2 97%  LMP 05/07/2013 CONSTITUTIONAL: Alert and oriented and responds appropriately to questions. Well-appearing; well-nourished, febrile but nontoxic appearing HEAD: Normocephalic EYES: Conjunctivae clear, PERRL ENT: normal nose; no rhinorrhea; moist mucous membranes; pharynx without lesions noted; no tonsillar hypertrophy or exudate, no uvular deviation, no trismus or drooling, normal phonation, no stridor, no dental caries or abscess noted, no Ludwig's angina, tongue sits flat in the bottom of the mouth NECK: Supple, no meningismus, no LAD; no nuchal rigidity CARD: RRR; S1 and S2 appreciated; no murmurs, no clicks, no rubs, no gallops RESP: Normal chest excursion without splinting or tachypnea; breath sounds clear and equal bilaterally; no wheezes, no  rhonchi, no rales, no hypoxia or respiratory distress, speaking full sentences ABD/GI: Normal bowel sounds; non-distended; soft, non-tender, no rebound, no guarding, no peritoneal signs BACK:  The back appears normal and is non-tender to palpation, there is no CVA tenderness EXT: Normal ROM in all joints; non-tender to palpation; no edema; normal capillary  refill; no cyanosis, no calf tenderness or swelling    SKIN: Normal color for age and race; warm, no rash NEURO: Moves all extremities equally, sensation to light touch intact diffusely, cranial nerves II through XII intact PSYCH: The patient's mood and manner are appropriate. Grooming and personal hygiene are appropriate.  MEDICAL DECISION MAKING: Patient here with flulike symptoms but also with an episode of chest pain that lasted 2 hours yesterday evening. She has no risk factors for ACS or pulmonary embolus. No signs of meningismus on exam. Lungs are clear chest x-ray shows no infiltrate. Abdomen is soft and nontender but she has had vomiting and abdominal cramping. Complaining of mild headache. Has had history of migraines and states this for similar. No new neurologic deficits. We'll treat symptomatically with Toradol, Bentyl and reassess. She declines nausea medicine. Patient's labs unremarkable including one troponin. EKG shows no ischemic abnormality. Plan is to repeat second troponin and reassess after medications.  ED PROGRESS: Patient's second troponin is negative. Patient reports headache and abdominal cramping gone after Toradol and Bentyl. She is able to drink without difficulty. Given she has flulike symptoms and is within the window for treatment I have offered her Tamiflu which she states she would like a prescription for this medication. Will also discharge with ibuprofen and Zofran as needed. Have provided work note. Discussed alternating Tylenol and Motrin for fever and pain. Recommended rest and increased fluid intake. At this time there are no signs of bacterial infection that would require antibiotics. She verbalized understanding and is comfortable with this plan.    EKG Interpretation  Date/Time:  Tuesday March 01 2015 22:03:56 EST Ventricular Rate:  113 PR Interval:  154 QRS Duration: 84 QT Interval:  324 QTC Calculation: 444 R Axis:   57 Text Interpretation:  Sinus  tachycardia Nonspecific T wave abnormality Abnormal ECG No old tracing to compare Confirmed by WARD,  DO, KRISTEN [phone]) on 03/02/2015 4:48:27 AM        Layla Maw Ward, DO 03/02/15 0730

## 2015-05-07 IMAGING — CT CT HEAD W/O CM
1 of 2 series · 16 of 30 positions shown, 20 images · non-contrast
Comparison: None.

CLINICAL DATA: Fall

EXAM:
CT HEAD WITHOUT CONTRAST
TECHNIQUE: Contiguous axial images were obtained from the base of the skull
through the vertex without intravenous contrast.

[Series 3: head 2.0 h70h · axial · 0.44mm/px · z∈[-111,+27]mm · 16 of 77 slices shown, 20 images]
[im 4/77  brain]
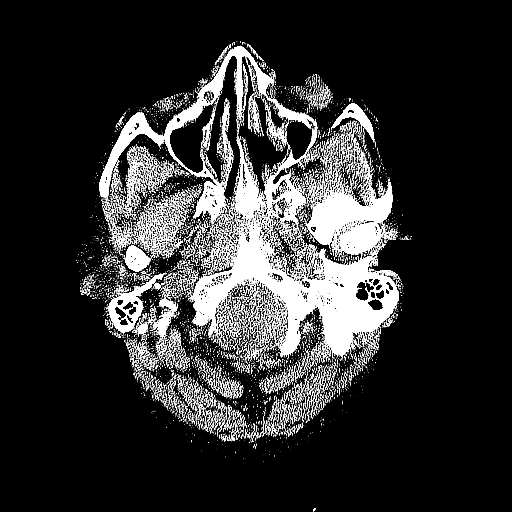
[im 4/77  bone]
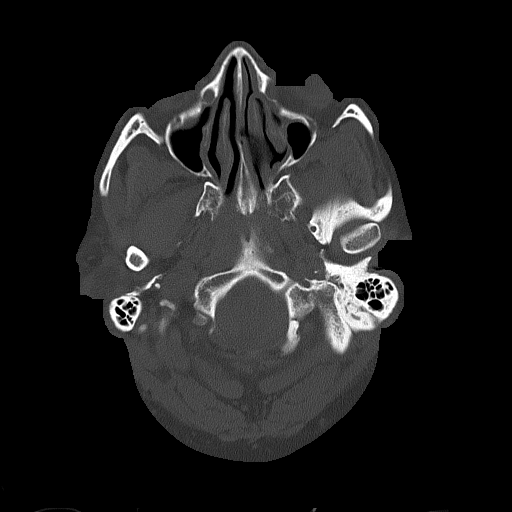
[im 8/77  brain]
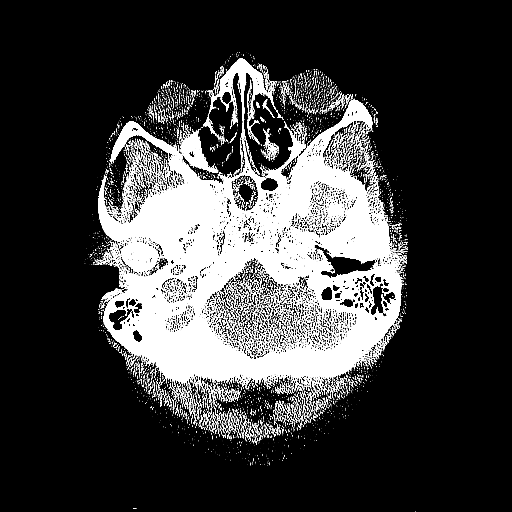
[im 12/77  brain]
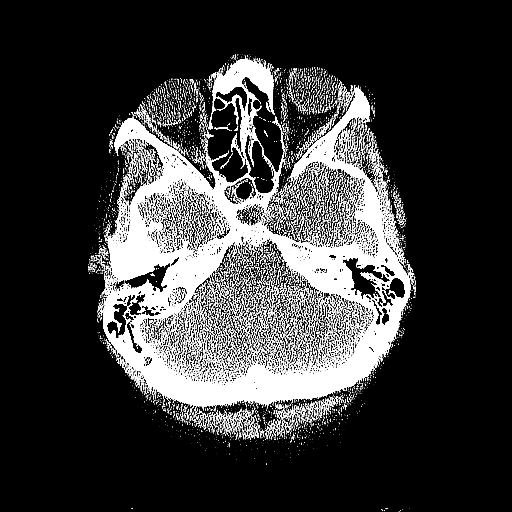
[im 20/77  brain]
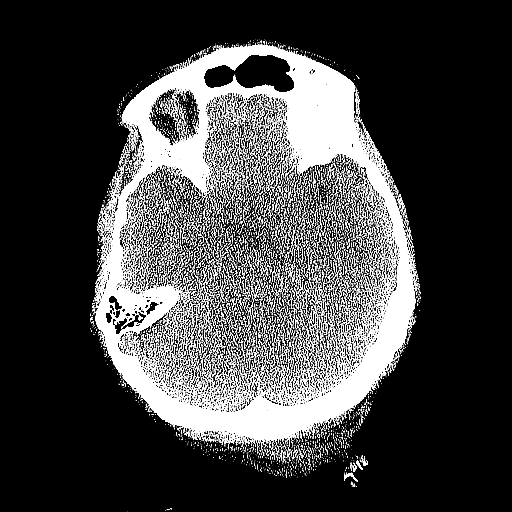
[im 23/77  brain]
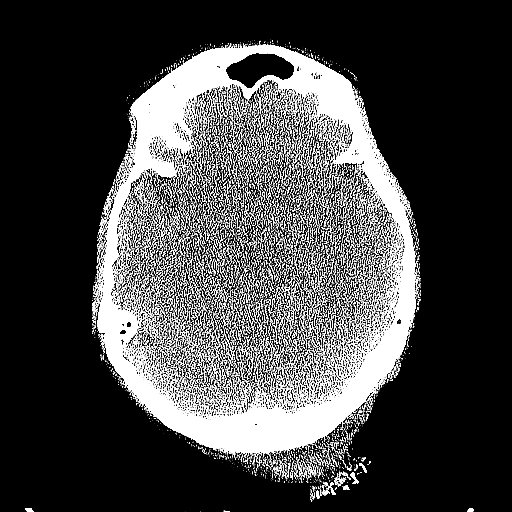
[im 23/77  bone]
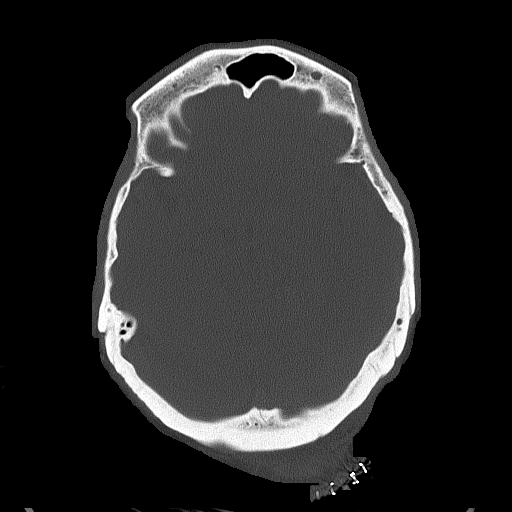
[im 27/77  brain]
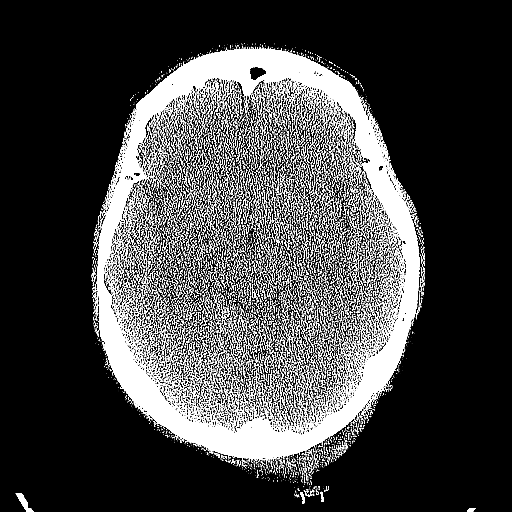
[im 31/77  brain]
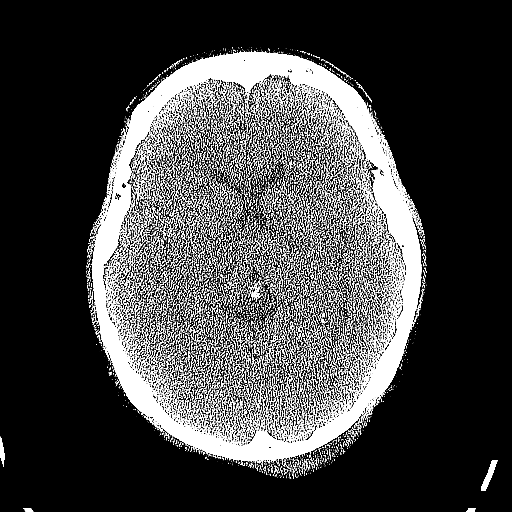
[im 35/77  brain]
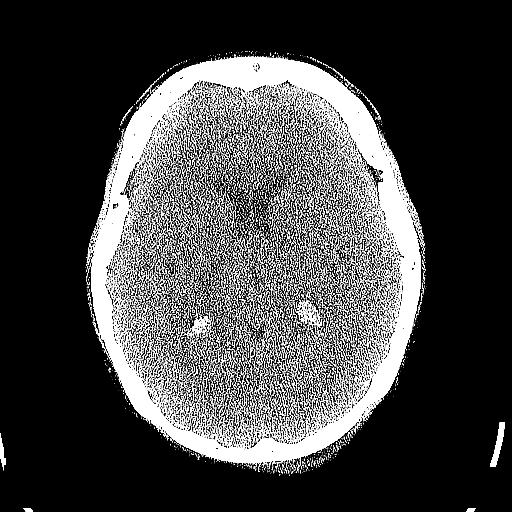
[im 42/77  brain]
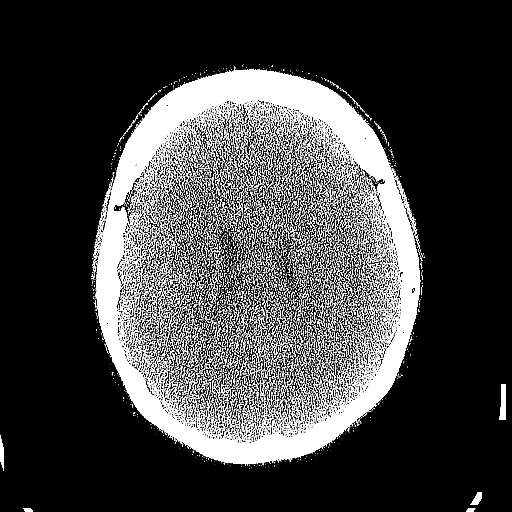
[im 42/77  bone]
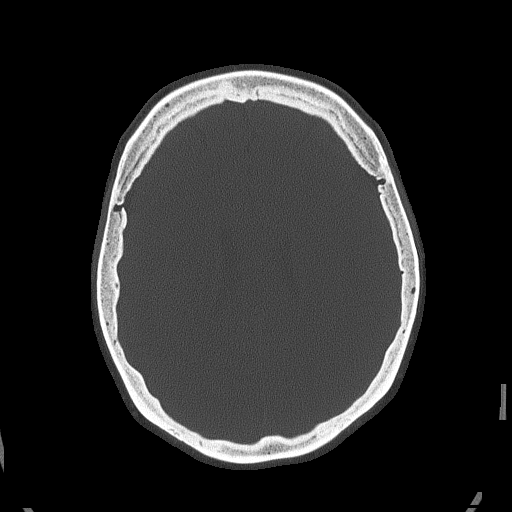
[im 46/77  brain]
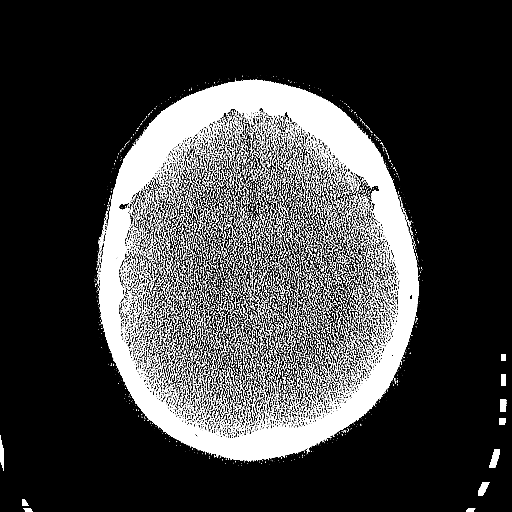
[im 50/77  brain]
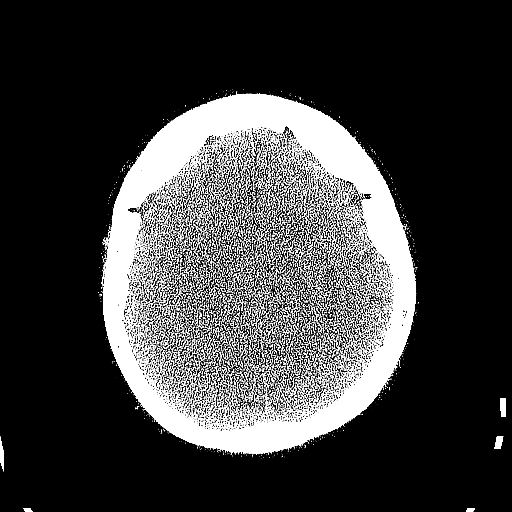
[im 54/77  brain]
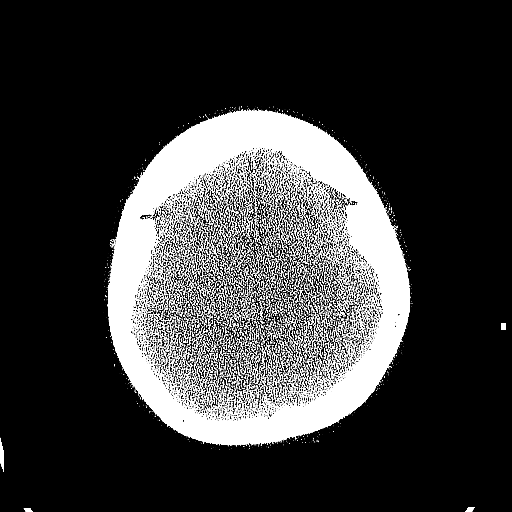
[im 58/77  brain]
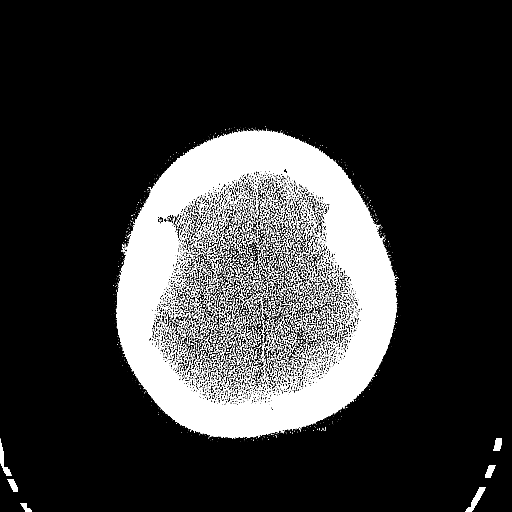
[im 58/77  bone]
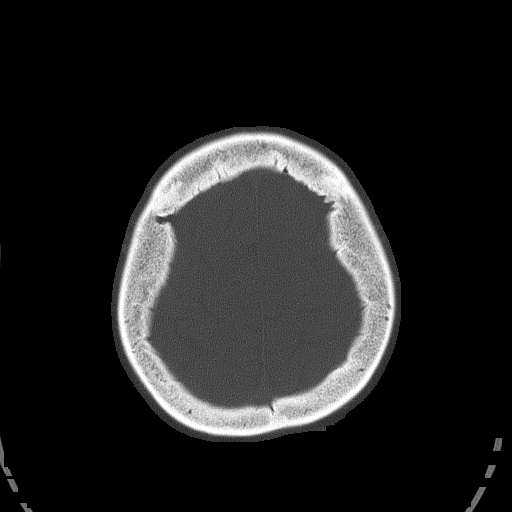
[im 65/77  brain]
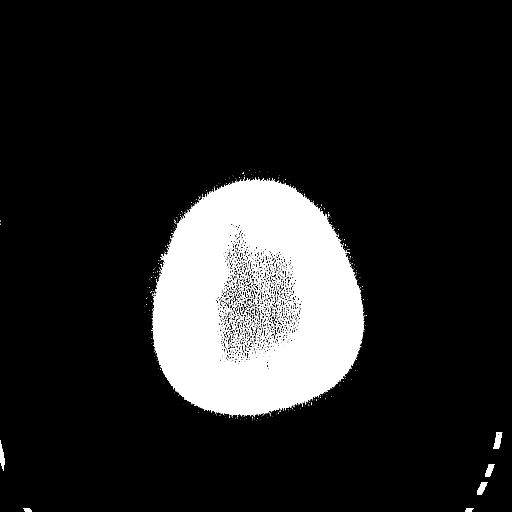
[im 69/77  brain]
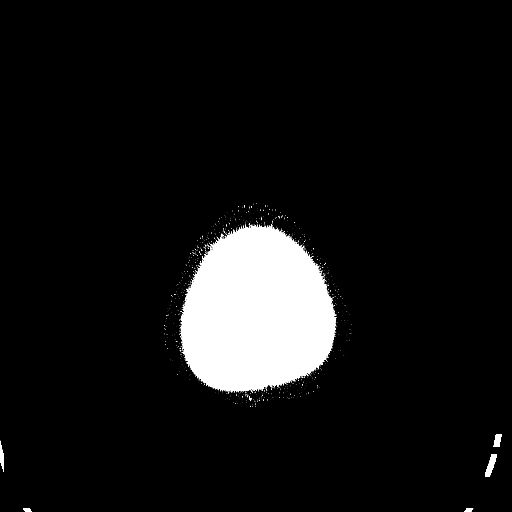
[im 73/77  brain]
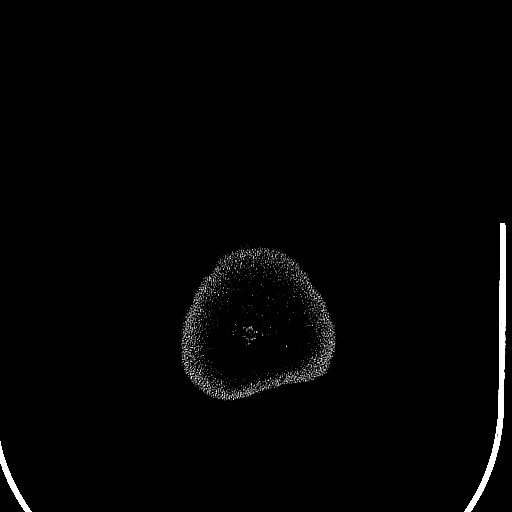

[16 of 30 positions shown; findings below may reference images not displayed]

FINDINGS: There is no acute intracranial hemorrhage or infarct. No mass lesion
or midline shift. Gray-white matter differentiation is well
maintained. Ventricles are normal in size without evidence of
hydrocephalus. CSF containing spaces are within normal limits. No
extra-axial fluid collection.

The calvarium is intact.

Orbital soft tissues are within normal limits.

Moderate mucosal thickening present within the right sphenoid sinus.
Paranasal sinuses are otherwise clear. No mastoid effusion.

Left occipital scalp contusion with laceration is present.
IMPRESSION: Left occipital scalp contusion/laceration. No acute intracranial
abnormality identified.

## 2016-11-20 ENCOUNTER — Ambulatory Visit (HOSPITAL_COMMUNITY)
Admission: EM | Admit: 2016-11-20 | Discharge: 2016-11-20 | Disposition: A | Discharge status: Home / Self Care | Payer: BLUE CROSS/BLUE SHIELD | Attending: Internal Medicine | Admitting: Internal Medicine

## 2016-11-20 DIAGNOSIS — K529 Noninfective gastroenteritis and colitis, unspecified: Secondary | ICD-10-CM | POA: Diagnosis not present

## 2016-11-20 MED ORDER — ONDANSETRON 8 MG PO TBDP: 8.0000 mg | ORAL_TABLET | Freq: Three times a day (TID) | ORAL | 0 refills | Status: AC | PRN

## 2016-11-20 NOTE — ED Triage Notes (Signed)
Pt here for N/V and some abd pain with fever

## 2016-11-20 NOTE — ED Provider Notes (Signed)
[facility]    CSN: [id number] Arrival date & time: 11/20/16  0957     History   Chief Complaint Chief Complaint  Patient presents with  . Emesis  . Fever    HPI Andrea Wood is a 56 y.o. female.   Went to a party on Saturday with granddaughter and developed nausea, vomiting, diarrhea and abdominal pain on Sunday. Granddaughter is experiencing similar symptoms. Abdominal pain is generalized and intermittent. Last episode of diarrhea and vomiting were both yesterday. Patient reports subjective fever last night. She denies blood in emesis or stool. Emesis non-projectile or bilious. Denies vaginal discharge or urinary symptoms. Symptoms are slightly improved today.   Repeat BP is 147/85   The history is provided by the patient.    Past Medical History:  Diagnosis Date  . Asthma     There are no active problems to display for this patient.   Past Surgical History:  Procedure Laterality Date  . CESAREAN SECTION    . CYSTECTOMY     right forearm  . RETINAL DETACHMENT SURGERY    . RETINAL LASER PROCEDURE      OB History    No data available       Home Medications    Prior to Admission medications   Medication Sig Start Date End Date Taking? Authorizing Provider  ibuprofen (ADVIL,MOTRIN) 800 MG tablet Take 1 tablet (800 mg total) by mouth every 8 (eight) hours as needed for mild pain. 03/02/15   Ward, Layla Maw, DO  ondansetron (ZOFRAN ODT) 8 MG disintegrating tablet Take 1 tablet (8 mg total) every 8 (eight) hours as needed for up to 5 days by mouth for nausea or vomiting. 11/20/16 11/25/16  Lucia Estelle, NP  oseltamivir (TAMIFLU) 75 MG capsule Take 1 capsule (75 mg total) by mouth every 12 (twelve) hours. 03/02/15   Ward, Layla Maw, DO    Family History No family history on file.  Social History Social History   Tobacco Use  . Smoking status: Never Smoker  . Smokeless tobacco: Never Used  Substance Use Topics  . Alcohol use: No  . Drug  use: No     Allergies   Penicillins and Shellfish allergy   Review of Systems Review of Systems  Constitutional:       See HPI     Physical Exam Triage Vital Signs ED Triage Vitals [11/20/16 1018]  Enc Vitals Group     BP (!) 178/87     Pulse Rate 70     Resp 18     Temp 98.9 F (37.2 C)     Temp Source Oral     SpO2 98 %     Weight      Height      Head Circumference      Peak Flow      Pain Score 7     Pain Loc      Pain Edu?      Excl. in GC?    No data found.  Updated Vital Signs BP (!) 178/87 (BP Location: Right Arm)   Pulse 70   Temp 98.9 F (37.2 C) (Oral)   Resp 18   LMP 05/07/2013   SpO2 98%   Visual Acuity Right Eye Distance:   Left Eye Distance:   Bilateral Distance:    Right Eye Near:   Left Eye Near:    Bilateral Near:     Physical Exam  Constitutional: She is oriented to person, place,  and time. She appears well-developed and well-nourished. No distress.  HENT:  Head: Normocephalic and atraumatic.  Mouth/Throat: Oropharynx is clear and moist.  Eyes: Pupils are equal, round, and reactive to light.  Cardiovascular: Normal rate, regular rhythm and normal heart sounds.  No murmur heard. Pulmonary/Chest: Effort normal and breath sounds normal. She has no wheezes.  Abdominal: Soft. Bowel sounds are normal. There is tenderness (Generalized tenderness on palpation).  Musculoskeletal: Normal range of motion.  Lymphadenopathy:    She has no cervical adenopathy.  Neurological: She is alert and oriented to person, place, and time.  Skin: Skin is warm and dry.  Nursing note and vitals reviewed.    UC Treatments / Results  Labs (all labs ordered are listed, but only abnormal results are displayed) Labs Reviewed - No data to display  EKG  EKG Interpretation None       Radiology No results found.  Procedures Procedures (including critical care time)  Medications Ordered in UC Medications - No data to display   Initial  Impression / Assessment and Plan / UC Course  I have reviewed the triage vital signs and the nursing notes.  Pertinent labs & imaging results that were available during my care of the patient were reviewed by me and considered in my medical decision making (see chart for details).   Final Clinical Impressions(s) / UC Diagnoses   Final diagnoses:  Gastroenteritis   Symptoms are most consistent with gastroenteritis. Prescription for Zofran given to help with nausea. Instructed on hydration and rest. May advance diet as tolerated. Work note given for 2 days. Return precautions discussed.  ED Discharge Orders        Ordered    ondansetron (ZOFRAN ODT) 8 MG disintegrating tablet  Every 8 hours PRN     11/20/16 1041     Controlled Substance Prescriptions [place] Controlled Substance Registry consulted? Not Applicable   Lucia EstelleZheng, Ivis Henneman, NP 11/20/16 1049

## 2018-03-13 ENCOUNTER — Other Ambulatory Visit: Payer: Self-pay

## 2018-03-13 ENCOUNTER — Encounter (HOSPITAL_COMMUNITY): Payer: Self-pay | Admitting: Emergency Medicine

## 2018-03-13 ENCOUNTER — Ambulatory Visit (INDEPENDENT_AMBULATORY_CARE_PROVIDER_SITE_OTHER): Payer: BLUE CROSS/BLUE SHIELD

## 2018-03-13 ENCOUNTER — Ambulatory Visit (HOSPITAL_COMMUNITY)
Admission: EM | Admit: 2018-03-13 | Discharge: 2018-03-13 | Disposition: A | Discharge status: Home / Self Care | Payer: BLUE CROSS/BLUE SHIELD | Attending: Internal Medicine | Admitting: Internal Medicine

## 2018-03-13 DIAGNOSIS — J4531 Mild persistent asthma with (acute) exacerbation: Secondary | ICD-10-CM | POA: Diagnosis not present

## 2018-03-13 DIAGNOSIS — R05 Cough: Secondary | ICD-10-CM | POA: Diagnosis not present

## 2018-03-13 DIAGNOSIS — R0602 Shortness of breath: Secondary | ICD-10-CM

## 2018-03-13 MED ORDER — METHYLPREDNISOLONE SODIUM SUCC 125 MG IJ SOLR
60.0000 mg | Freq: Once | INTRAMUSCULAR | Status: AC
  Administered 2018-03-13: 0.96 mg via INTRAMUSCULAR

## 2018-03-13 MED ORDER — ALBUTEROL SULFATE (2.5 MG/3ML) 0.083% IN NEBU
INHALATION_SOLUTION | RESPIRATORY_TRACT | Status: AC
  Filled 2018-03-13: qty 3

## 2018-03-13 MED ORDER — ALBUTEROL SULFATE (2.5 MG/3ML) 0.083% IN NEBU
2.5000 mg | INHALATION_SOLUTION | Freq: Once | RESPIRATORY_TRACT | Status: AC
  Administered 2018-03-13: 2.5 mg via RESPIRATORY_TRACT

## 2018-03-13 MED ORDER — METHYLPREDNISOLONE SODIUM SUCC 125 MG IJ SOLR
INTRAMUSCULAR | Status: AC
  Filled 2018-03-13: qty 2

## 2018-03-13 MED ORDER — ALBUTEROL SULFATE HFA 108 (90 BASE) MCG/ACT IN AERS: 1.0000 | INHALATION_SPRAY | Freq: Four times a day (QID) | RESPIRATORY_TRACT | 0 refills | Status: DC | PRN

## 2018-03-13 MED ORDER — LEVOFLOXACIN 500 MG PO TABS: 500.0000 mg | ORAL_TABLET | Freq: Every day | ORAL | 0 refills | Status: AC

## 2018-03-13 MED ORDER — PREDNISONE 10 MG PO TABS: 20.0000 mg | ORAL_TABLET | Freq: Every day | ORAL | 0 refills | Status: AC

## 2018-03-13 NOTE — ED Provider Notes (Addendum)
[facility]    CSN: [id number] Arrival date & time: 03/13/18  0806     History   Chief Complaint Chief Complaint  Patient presents with  . Shortness of Breath  . URI    HPI Andrea Wood is a 58 y.o. female Asthma Exacerbation: She presents for initial evaluation of asthma, currently in exacerbation.  She has previously been diagnosed with asthma. This exacerbation began 5 day ago. Associated symptoms include dyspnea, fever, myalgias, nonproductive cough, rhinorrhea clear, sore throat and wheezing. Suspected precipitants include infection. Symptoms have been gradually worsening since their onset. This is the first evaluation that has occurred during this exacerbation. The previous exacerbation occurred several years ago. She has treated this current exacerbation with currently not using any medications. The patient has been deviating from this regimen as follows: run out of albuterol inhaler.  Not associated with any lower extremity swelling, orthopnea paroxysmal nocturnal dyspnea.     HPI  Past Medical History:  Diagnosis Date  . Asthma     There are no active problems to display for this patient.   Past Surgical History:  Procedure Laterality Date  . CESAREAN SECTION    . CYSTECTOMY     right forearm  . RETINAL DETACHMENT SURGERY    . RETINAL LASER PROCEDURE      OB History   No obstetric history on file.      Home Medications    Prior to Admission medications   Medication Sig Start Date End Date Taking? Authorizing Provider  ibuprofen (ADVIL,MOTRIN) 800 MG tablet Take 1 tablet (800 mg total) by mouth every 8 (eight) hours as needed for mild pain. 03/02/15   Ward, Layla Maw, DO  oseltamivir (TAMIFLU) 75 MG capsule Take 1 capsule (75 mg total) by mouth every 12 (twelve) hours. 03/02/15   Ward, Layla Maw, DO  fluticasone (FLONASE) 50 MCG/ACT nasal spray Place 2 sprays into both nostrils daily. 04/20/13 05/07/13  Reuben Likes, MD    Family  History No family history on file.  Social History Social History   Tobacco Use  . Smoking status: Never Smoker  . Smokeless tobacco: Never Used  Substance Use Topics  . Alcohol use: No  . Drug use: No     Allergies   Penicillins and Shellfish allergy   Review of Systems Review of Systems  Constitutional: Negative for activity change and appetite change.  HENT: Negative for facial swelling, mouth sores, postnasal drip, sinus pressure, sinus pain and voice change.   Eyes: Negative for pain, discharge, redness and itching.  Respiratory: Negative for apnea and stridor.   Cardiovascular: Negative for chest pain and palpitations.  Gastrointestinal: Negative for abdominal distention, abdominal pain, anal bleeding and constipation.  Endocrine: Negative for polydipsia.  Genitourinary: Negative for dysuria, frequency and urgency.  Musculoskeletal: Positive for arthralgias, back pain and myalgias. Negative for joint swelling.  Skin: Negative for rash and wound.  Neurological: Negative for dizziness, facial asymmetry, light-headedness and numbness.  Hematological: Negative for adenopathy.  Psychiatric/Behavioral: Negative for agitation and confusion.     Physical Exam Triage Vital Signs ED Triage Vitals  Enc Vitals Group     BP 03/13/18 0836 133/86     Pulse Rate 03/13/18 0836 82     Resp 03/13/18 0836 16     Temp 03/13/18 0836 97.8 F (36.6 C)     Temp Source 03/13/18 0836 Oral     SpO2 03/13/18 0836 95 %     Weight --  Height --      Head Circumference --      Peak Flow --      Pain Score 03/13/18 0834 0     Pain Loc --      Pain Edu? --      Excl. in GC? --    No data found.  Updated Vital Signs BP 133/86   Pulse 82   Temp 97.8 F (36.6 C) (Oral)   Resp 16   LMP 05/07/2013   SpO2 95%   Visual Acuity Right Eye Distance:   Left Eye Distance:   Bilateral Distance:    Right Eye Near:   Left Eye Near:    Bilateral Near:     Physical  Exam Constitutional:      General: She is not in acute distress.    Appearance: She is ill-appearing. She is not toxic-appearing.  HENT:     Mouth/Throat:     Mouth: Mucous membranes are moist.     Pharynx: No pharyngeal swelling or oropharyngeal exudate.  Neck:     Musculoskeletal: Normal range of motion.     Thyroid: No thyromegaly.  Cardiovascular:     Rate and Rhythm: Normal rate and regular rhythm.  No extrasystoles are present.    Pulses: Normal pulses.     Heart sounds: Normal heart sounds. No murmur. No gallop.   Pulmonary:     Effort: Pulmonary effort is normal. No tachypnea.     Breath sounds: Normal breath sounds. No stridor. No wheezing, rhonchi or rales.  Chest:     Chest wall: No mass or tenderness.  Abdominal:     General: Bowel sounds are normal.     Palpations: Abdomen is soft. There is no hepatomegaly or mass.  Musculoskeletal: Normal range of motion.  Skin:    General: Skin is warm.     Capillary Refill: Capillary refill takes less than 2 seconds.     Findings: No erythema or rash.  Neurological:     General: No focal deficit present.     Mental Status: She is alert and oriented to person, place, and time.     Cranial Nerves: No cranial nerve deficit.  Psychiatric:        Mood and Affect: Mood normal.      UC Treatments / Results  Labs (all labs ordered are listed, but only abnormal results are displayed) Labs Reviewed - No data to display  EKG None  Radiology No results found.  Procedures Procedures (including critical care time)  Medications Ordered in UC Medications - No data to display  Initial Impression / Assessment and Plan / UC Course  I have reviewed the triage vital signs and the nursing notes.  Pertinent labs & imaging results that were available during my care of the patient were reviewed by me and considered in my medical decision making (see chart for details).     1.  Mild persistent asthma with acute  exacerbation: Albuterol nebulization treatment Solu-Medrol 60 mg IM x1 Chest x-ray At discharge, prednisone 20 mg orally daily for 5 days, albuterol inhaler as needed  2.  Community-acquired pneumonia: Levaquin  daily for 5 days Chest x-ray showing mild right infrahilar infiltrates.  Final Clinical Impressions(s) / UC Diagnoses   Final diagnoses:  None   Discharge Instructions   None    ED Prescriptions    None     Controlled Substance Prescriptions [place] Controlled Substance Registry consulted? No   Merrilee Jansky, MD 03/13/18  6720    Merrilee Jansky, MD 03/13/18 9470    Merrilee Jansky, MD 03/13/18 [phone]

## 2018-03-13 NOTE — ED Triage Notes (Signed)
PT reports intermittent fever, wheezing (his. Asthma), SOB, and cough for 1 week.

## 2018-11-15 ENCOUNTER — Ambulatory Visit (HOSPITAL_COMMUNITY)
Admission: EM | Admit: 2018-11-15 | Discharge: 2018-11-15 | Disposition: A | Discharge status: Home / Self Care | Payer: BC Managed Care – PPO | Attending: Emergency Medicine | Admitting: Emergency Medicine

## 2018-11-15 ENCOUNTER — Encounter (HOSPITAL_COMMUNITY): Payer: Self-pay | Admitting: Emergency Medicine

## 2018-11-15 DIAGNOSIS — Z7952 Long term (current) use of systemic steroids: Secondary | ICD-10-CM | POA: Insufficient documentation

## 2018-11-15 DIAGNOSIS — J069 Acute upper respiratory infection, unspecified: Secondary | ICD-10-CM | POA: Diagnosis not present

## 2018-11-15 DIAGNOSIS — Z880 Allergy status to penicillin: Secondary | ICD-10-CM | POA: Diagnosis not present

## 2018-11-15 DIAGNOSIS — Z91013 Allergy to seafood: Secondary | ICD-10-CM | POA: Insufficient documentation

## 2018-11-15 DIAGNOSIS — J4521 Mild intermittent asthma with (acute) exacerbation: Secondary | ICD-10-CM | POA: Diagnosis not present

## 2018-11-15 DIAGNOSIS — U071 COVID-19: Secondary | ICD-10-CM | POA: Diagnosis not present

## 2018-11-15 MED ORDER — PREDNISONE 50 MG PO TABS: 50.0000 mg | ORAL_TABLET | Freq: Every day | ORAL | 0 refills | Status: AC

## 2018-11-15 MED ORDER — ALBUTEROL SULFATE HFA 108 (90 BASE) MCG/ACT IN AERS: 1.0000 | INHALATION_SPRAY | Freq: Four times a day (QID) | RESPIRATORY_TRACT | 0 refills | Status: AC | PRN

## 2018-11-15 MED ORDER — BENZONATATE 200 MG PO CAPS
200.0000 mg | ORAL_CAPSULE | Freq: Three times a day (TID) | ORAL | 0 refills | Status: AC | PRN
Start: 2018-11-15 — End: 2018-11-22

## 2018-11-15 MED ORDER — ONDANSETRON 4 MG PO TBDP: 4.0000 mg | ORAL_TABLET | Freq: Three times a day (TID) | ORAL | 0 refills | Status: AC | PRN

## 2018-11-15 NOTE — Discharge Instructions (Addendum)
COVID swab pending, please monitor My Chart for results Begin prednisone daily with food for the next 5 days to help with asthma/wheezing Continue albuterol inhaler as needed for shortness of breath and wheezing Tessalon as needed for cough every 8 hours Zofran as needed for nausea every 8 hours Please rest and drink plenty of fluids  Please follow-up if symptoms not resolving or worsening, developing increased shortness of breath or difficulty breathing

## 2018-11-15 NOTE — ED Provider Notes (Signed)
[facility]    CSN: [id number] Arrival date & time: 11/15/18  1033      History   Chief Complaint Chief Complaint  Patient presents with  . Cough  . Fever    HPI Andrea Wood is a 58 y.o. female history of asthma presenting today for evaluation of cough, body aches and fever.  Patient states that yesterday morning she woke up with congestion, sore throat and cough.  She noted a fever up to 102.4 yesterday.  She has had some body aches, most notably in her left lower back.  She also feels her asthma has flared up and has had some wheezing and shortness of breath.  She has not had any known direct exposures to Covid, but multiple people at work have had [place].  HPI  Past Medical History:  Diagnosis Date  . Asthma     There are no active problems to display for this patient.   Past Surgical History:  Procedure Laterality Date  . CESAREAN SECTION    . CYSTECTOMY     right forearm  . RETINAL DETACHMENT SURGERY    . RETINAL LASER PROCEDURE      OB History   No obstetric history on file.      Home Medications    Prior to Admission medications   Medication Sig Start Date End Date Taking? Authorizing Provider  albuterol (VENTOLIN HFA) 108 (90 Base) MCG/ACT inhaler Inhale 1-2 puffs into the lungs every 6 (six) hours as needed for wheezing or shortness of breath. 11/15/18   ,  C, PA-C  benzonatate (TESSALON) 200 MG capsule Take 1 capsule (200 mg total) by mouth 3 (three) times daily as needed for up to 7 days for cough. 11/15/18 11/22/18  ,  C, PA-C  predniSONE (DELTASONE) 50 MG tablet Take 1 tablet (50 mg total) by mouth daily for 5 days. 11/15/18 11/20/18  , Elesa Hacker, PA-C    Family History No family history on file.  Social History Social History   Tobacco Use  . Smoking status: Never Smoker  . Smokeless tobacco: Never Used  Substance Use Topics  . Alcohol use: No  . Drug use: No     Allergies   Penicillins  and Shellfish allergy   Review of Systems Review of Systems  Constitutional: Positive for fatigue. Negative for activity change, appetite change, chills and fever.  HENT: Positive for congestion, rhinorrhea and sore throat. Negative for ear pain, sinus pressure and trouble swallowing.   Eyes: Negative for discharge and redness.  Respiratory: Positive for cough. Negative for chest tightness and shortness of breath.   Cardiovascular: Negative for chest pain.  Gastrointestinal: Positive for nausea. Negative for abdominal pain, diarrhea and vomiting.  Musculoskeletal: Positive for myalgias.  Skin: Negative for rash.  Neurological: Negative for dizziness, light-headedness and headaches.     Physical Exam Triage Vital Signs ED Triage Vitals [11/15/18 1101]  Enc Vitals Group     BP (!) 149/88     Pulse Rate (!) 105     Resp 18     Temp 98.1 F (36.7 C)     Temp src      SpO2 98 %     Weight      Height      Head Circumference      Peak Flow      Pain Score 7     Pain Loc      Pain Edu?      Excl. in  GC?    No data found.  Updated Vital Signs BP (!) 149/88   Pulse (!) 105   Temp 98.1 F (36.7 C)   Resp 18   LMP 05/07/2013   SpO2 98%   Visual Acuity Right Eye Distance:   Left Eye Distance:   Bilateral Distance:    Right Eye Near:   Left Eye Near:    Bilateral Near:     Physical Exam Vitals signs and nursing note reviewed.  Constitutional:      General: She is not in acute distress.    Appearance: She is well-developed.  HENT:     Head: Normocephalic and atraumatic.     Ears:     Comments: Bilateral TMs difficult to visualize due to cerumen    Nose:     Comments: Nose mucosa erythematous, mildly swollen turbinates bilaterally    Mouth/Throat:     Comments: Oral mucosa pink and moist, no tonsillar enlargement or exudate. Posterior pharynx patent and nonerythematous, no uvula deviation or swelling. Normal phonation. Eyes:     Conjunctiva/sclera: Conjunctivae  normal.  Neck:     Musculoskeletal: Neck supple.  Cardiovascular:     Rate and Rhythm: Regular rhythm. Tachycardia present.     Heart sounds: No murmur.  Pulmonary:     Effort: Pulmonary effort is normal. No respiratory distress.     Breath sounds: Normal breath sounds.     Comments: Breathing comfortably at rest, CTABL, no wheezing, rales or other adventitious sounds auscultated Abdominal:     Palpations: Abdomen is soft.     Tenderness: There is no abdominal tenderness.  Skin:    General: Skin is warm and dry.  Neurological:     Mental Status: She is alert.      UC Treatments / Results  Labs (all labs ordered are listed, but only abnormal results are displayed) Labs Reviewed  NOVEL CORONAVIRUS, NAA (HOSP ORDER, SEND-OUT TO REF LAB; TAT 18-24 HRS)    EKG   Radiology No results found.  Procedures Procedures (including critical care time)  Medications Ordered in UC Medications - No data to display  Initial Impression / Assessment and Plan / UC Course  I have reviewed the triage vital signs and the nursing notes.  Pertinent labs & imaging results that were available during my care of the patient were reviewed by me and considered in my medical decision making (see chart for details).     2 days of URI symptoms with cough.  Vital signs stable today with mild tachycardia, reported fever yesterday.  Covid swab pending.  Lungs clear to auscultation.  Refilled albuterol inhaler, provided prednisone x5 days, Tessalon for cough, recommended continue to use Mucinex.  Zofran as needed for nausea.  Patient was also evaluated and examined by Dr. Leonides Grills.  Discussed strict return precautions. Patient verbalized understanding and is agreeable with plan.  Final Clinical Impressions(s) / UC Diagnoses   Final diagnoses:  Viral URI with cough  Mild intermittent asthma with acute exacerbation     Discharge Instructions     COVID swab pending, please monitor My Chart for  results Begin prednisone daily with food for the next 5 days to help with asthma/wheezing Continue albuterol inhaler as needed for shortness of breath and wheezing Tessalon as needed for cough every 8 hours Please rest and drink plenty of fluids  Please follow-up if symptoms not resolving or worsening, developing increased shortness of breath or difficulty breathing   ED Prescriptions    Medication  Sig Dispense Auth. Provider   albuterol (VENTOLIN HFA) 108 (90 Base) MCG/ACT inhaler Inhale 1-2 puffs into the lungs every 6 (six) hours as needed for wheezing or shortness of breath. 8 g ,  C, PA-C   predniSONE (DELTASONE) 50 MG tablet Take 1 tablet (50 mg total) by mouth daily for 5 days. 5 tablet ,  C, PA-C   benzonatate (TESSALON) 200 MG capsule Take 1 capsule (200 mg total) by mouth 3 (three) times daily as needed for up to 7 days for cough. 28 capsule , [place] C, PA-C     PDMP not reviewed this encounter.   Lew Dawes,  C, PA-C 11/15/18 1148

## 2018-11-15 NOTE — ED Triage Notes (Signed)
Pt c/o cough, fever, body aches since yesterday, took her temp yesterday and it was 102.4.

## 2018-11-16 LAB — NOVEL CORONAVIRUS, NAA (HOSP ORDER, SEND-OUT TO REF LAB; TAT 18-24 HRS): SARS-CoV-2, NAA: DETECTED — AB

## 2018-11-18 ENCOUNTER — Telehealth (HOSPITAL_COMMUNITY): Payer: Self-pay | Admitting: Emergency Medicine

## 2018-11-18 MED ORDER — PREDNISONE 10 MG (21) PO TBPK: ORAL_TABLET | Freq: Every day | ORAL | 0 refills | Status: AC

## 2018-11-18 NOTE — Telephone Encounter (Signed)
Positive covid. Discussed lab results with patient who verbalized understanding. Discussed quarantine time period for herself and her grandchildren. Gave address for drive through testing if needed. Work note sent to [name]. Pt requesting extension of prednisone due to "breathing harder". Reviewed symptoms and request with Dr. Palma Holter, will send in prednisone taper to pharmacy. All questions answered.

## 2024-02-14 ENCOUNTER — Other Ambulatory Visit: Payer: Self-pay

## 2024-02-14 DIAGNOSIS — Z1231 Encounter for screening mammogram for malignant neoplasm of breast: Secondary | ICD-10-CM

## 2024-02-19 ENCOUNTER — Inpatient Hospital Stay: Admission: RE | Admit: 2024-02-19 | Discharge: 2024-02-19

## 2024-02-19 DIAGNOSIS — Z1231 Encounter for screening mammogram for malignant neoplasm of breast: Secondary | ICD-10-CM
